# Patient Record
Sex: Female | Born: 1970 | Race: Black or African American | Hispanic: No | Marital: Married | Smoking: Never smoker
Health system: Southern US, Community
[De-identification: ages and names within clinical notes are randomized; demographics above are authoritative.]

## PROBLEM LIST (undated history)

## (undated) DIAGNOSIS — K219 Gastro-esophageal reflux disease without esophagitis: Secondary | ICD-10-CM

## (undated) DIAGNOSIS — R51 Headache: Secondary | ICD-10-CM

## (undated) DIAGNOSIS — K760 Fatty (change of) liver, not elsewhere classified: Secondary | ICD-10-CM

## (undated) DIAGNOSIS — N920 Excessive and frequent menstruation with regular cycle: Secondary | ICD-10-CM

## (undated) DIAGNOSIS — H409 Unspecified glaucoma: Secondary | ICD-10-CM

## (undated) DIAGNOSIS — R519 Headache, unspecified: Secondary | ICD-10-CM

## (undated) DIAGNOSIS — R600 Localized edema: Secondary | ICD-10-CM

## (undated) HISTORY — DX: Excessive and frequent menstruation with regular cycle: N92.0

## (undated) HISTORY — DX: Unspecified glaucoma: H40.9

---

## 1999-06-11 ENCOUNTER — Encounter: Payer: Self-pay | Admitting: Family Medicine

## 1999-06-11 ENCOUNTER — Encounter: Admission: RE | Admit: 1999-06-11 | Discharge: 1999-06-11 | Payer: Self-pay | Admitting: Family Medicine

## 2003-07-17 ENCOUNTER — Ambulatory Visit (HOSPITAL_COMMUNITY): Admission: RE | Admit: 2003-07-17 | Discharge: 2003-07-17 | Payer: Self-pay | Admitting: Obstetrics and Gynecology

## 2003-12-11 ENCOUNTER — Inpatient Hospital Stay (HOSPITAL_COMMUNITY): Admission: AD | Admit: 2003-12-11 | Discharge: 2003-12-15 | Payer: Self-pay | Admitting: Obstetrics and Gynecology

## 2003-12-12 ENCOUNTER — Encounter (INDEPENDENT_AMBULATORY_CARE_PROVIDER_SITE_OTHER): Payer: Self-pay | Admitting: *Deleted

## 2006-02-01 ENCOUNTER — Other Ambulatory Visit: Admission: RE | Admit: 2006-02-01 | Discharge: 2006-02-01 | Payer: Self-pay | Admitting: Obstetrics and Gynecology

## 2010-04-18 ENCOUNTER — Ambulatory Visit: Payer: Self-pay | Admitting: Family Medicine

## 2010-04-18 DIAGNOSIS — M549 Dorsalgia, unspecified: Secondary | ICD-10-CM | POA: Insufficient documentation

## 2010-04-18 DIAGNOSIS — K921 Melena: Secondary | ICD-10-CM

## 2010-04-18 LAB — CONVERTED CEMR LAB
Bilirubin Urine: NEGATIVE
Blood in Urine, dipstick: NEGATIVE
Glucose, Urine, Semiquant: NEGATIVE
Ketones, urine, test strip: NEGATIVE
Nitrite: NEGATIVE
Protein, U semiquant: NEGATIVE
Specific Gravity, Urine: 1.015
Urobilinogen, UA: 0.2
WBC Urine, dipstick: NEGATIVE
pH: 6.5

## 2010-04-22 ENCOUNTER — Telehealth (INDEPENDENT_AMBULATORY_CARE_PROVIDER_SITE_OTHER): Payer: Self-pay | Admitting: *Deleted

## 2010-04-22 LAB — CONVERTED CEMR LAB
ALT: 12 units/L (ref 0–35)
AST: 17 units/L (ref 0–37)
Albumin: 4 g/dL (ref 3.5–5.2)
Alkaline Phosphatase: 52 units/L (ref 39–117)
BUN: 7 mg/dL (ref 6–23)
Basophils Absolute: 0 10*3/uL (ref 0.0–0.1)
Basophils Relative: 0.4 % (ref 0.0–3.0)
Bilirubin, Direct: 0.1 mg/dL (ref 0.0–0.3)
CO2: 26 meq/L (ref 19–32)
Calcium: 8.8 mg/dL (ref 8.4–10.5)
Chloride: 109 meq/L (ref 96–112)
Cholesterol: 149 mg/dL (ref 0–200)
Creatinine, Ser: 0.6 mg/dL (ref 0.4–1.2)
Eosinophils Absolute: 0.1 10*3/uL (ref 0.0–0.7)
Eosinophils Relative: 1.7 % (ref 0.0–5.0)
GFR calc non Af Amer: 136.7 mL/min (ref >60)
Glucose, Bld: 74 mg/dL (ref 70–99)
HCT: 34.6 % — ABNORMAL LOW (ref 36.0–46.0)
HCV Ab: NEGATIVE
HDL: 46.2 mg/dL (ref >39.00)
Hemoglobin: 11.5 g/dL — ABNORMAL LOW (ref 12.0–15.0)
Hep B C IgM: NEGATIVE
Hep B Core Total Ab: NEGATIVE
Hep B S Ab: NEGATIVE
Hepatitis B Surface Ag: NEGATIVE
LDL Cholesterol: 92 mg/dL (ref 0–99)
Lymphocytes Relative: 39.4 % (ref 12.0–46.0)
Lymphs Abs: 2.1 10*3/uL (ref 0.7–4.0)
MCHC: 33.3 g/dL (ref 30.0–36.0)
MCV: 82.5 fL (ref 78.0–100.0)
Monocytes Absolute: 0.3 10*3/uL (ref 0.1–1.0)
Monocytes Relative: 6.5 % (ref 3.0–12.0)
Neutro Abs: 2.7 10*3/uL (ref 1.4–7.7)
Neutrophils Relative %: 52 % (ref 43.0–77.0)
Platelets: 311 10*3/uL (ref 150.0–400.0)
Potassium: 4.2 meq/L (ref 3.5–5.1)
RBC: 4.19 M/uL (ref 3.87–5.11)
RDW: 14.5 % (ref 11.5–14.6)
Sodium: 141 meq/L (ref 135–145)
TSH: 0.85 microintl units/mL (ref 0.35–5.50)
Total Bilirubin: 0.5 mg/dL (ref 0.3–1.2)
Total CHOL/HDL Ratio: 3
Total Protein: 6.7 g/dL (ref 6.0–8.3)
Triglycerides: 55 mg/dL (ref 0.0–149.0)
VLDL: 11 mg/dL (ref 0.0–40.0)
Vit D, 25-Hydroxy: 10 ng/mL — ABNORMAL LOW (ref 30–89)
WBC: 5.3 10*3/uL (ref 4.5–10.5)

## 2010-09-23 NOTE — Assessment & Plan Note (Signed)
Summary: NEW EST   Vital Signs:  Patient profile:   40 year old female Height:      59.25 inches Weight:      159 pounds BMI:     31.96 Pulse rate:   74 / minute BP sitting:   120 / 78  (left arm)  Vitals Entered By: Doristine Devoid CMA (April 18, 2010 10:43 AM) CC: NEW EST- lower back pain and noticed blood in stool    History of Present Illness: Cheyenne Larsen here today to establish care.  previous MD- [facility].  GYNSu Hilt.  1) back pain- L sided, will occasionally radiate across the back.  typically lumbar.  sxs started 2-3 months ago.  pain will radiate into butt.  no numbness or tingling, leg weakness, no bowel or bladder incontinence, no fevers.  does a lot of bending and lifting w/ work.  no particular time of day for pain.  pain improves w/ ibuprofen and tylenol.  sxs will occur at least 3x/week.  not worse w/ any particular movement.  2) blood in stool- had a 'very hard' BM the other morning.  when she wiped, 'it was nothing but blood'.  no blood since.  no blood in or on stool, just on TP.  hx of hemorrhoids 'years ago'.  stool was initially painful to pass.  no continued pain.  3) ? hepatitis exposure- pt reports husband was recently ill and when he had lab work done he was told his liver fxn was elevated and 'had hepatitis'.  had liver fxn repeated and 'they said everything was fine'.  reports he was previously a heavy drinker but wants to know what kind of hepatitis he had, and wants to know if she has it.    Preventive Screening-Counseling & Management  Alcohol-Tobacco     Alcohol drinks/day: 0     Smoking Status: never  Caffeine-Diet-Exercise     Does Patient Exercise: no      Sexual History:  currently monogamous.        Drug Use:  never.    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 Lungs are clear to auscultation, no crackles or wheezes. Heart:  Normal rate and regular rhythm. S1 and S2 normal without gallop, murmur, click, rub or other extra sounds. Abdomen:  soft, NT/ND, +BS, no hepatosplenomegaly Rectal:  small skin tag. Normal sphincter tone. No rectal masses or tenderness.  heme (-) Msk:  + TTP over paraspinal muscles on L.  (-) SLR bilaterally.  mild discomfort w/ flexion and extension Pulses:  +2 carotid, radial, DP Extremities:  no C/C/E Neurologic:  strength normal in all extremities, gait normal, and DTRs symmetrical and normal.   Skin:  no jaundice Cervical Nodes:  No lymphadenopathy noted Psych:  Cognition and judgment appear intact. Alert and cooperative with normal attention span  and concentration. No apparent delusions, illusions, hallucinations   Impression & Recommendations:  Problem # 1:  BACK PAIN (ICD-724.5) Assessment New most likely muscular.  no TTP over spine.  no red flags on hx or PE.  start NSAIDs, muscle relaxer.  reviewed supportive care and red flags that should prompt return.  Pt expresses understanding and is in agreement w/ this plan. Orders: UA Dipstick w/o Micro (manual) (29518)  Her updated medication list for this problem includes:    Naproxen 500 Mg Tabs (Naproxen) .Marland Kitchen... 1 tab by mouth two times a day x7 days and then as needed.   take w/ food.    Cyclobenzaprine Hcl 10 Mg Tabs (Cyclobenzaprine hcl) .Marland Kitchen... 1 by mouth nightly as needed for back pain  Problem # 2:  BLOOD IN STOOL (ICD-578.1) Assessment: New no abnormality on PE w/ exception of skin tag.  heme (-) in office.  given hx most likely a fissure.  will follow but no additional w/u at this time.  Pt expresses understanding and is in agreement w/ this plan.  Problem # 3:  PHYSICAL EXAMINATION (ICD-V70.0) labs collected for upcoming CPE. Orders: Venipuncture (84166) T-Vitamin D (25-Hydroxy) (06301-60109) TLB-Lipid Panel (80061-LIPID) TLB-BMP (Basic Metabolic Panel-BMET) (80048-METABOL) TLB-CBC Platelet - w/Differential (85025-CBCD) TLB-Hepatic/Liver Function Pnl (80076-HEPATIC) TLB-TSH (Thyroid Stimulating Hormone) (84443-TSH)  Problem # 4:  CONTACT OR EXPOSURE TO OTHER VIRAL DISEASES (ICD-V01.79) Assessment: New given ? exposure to hepatitis will check labs. Orders: T-Hepatitis B Core Antibody, IGM (32355-73220) T-Hepatitis B Core Antibody (25427-06237) T-Hepatitis B Surface Antigen (62831-51761) T-Hepatitis B Surface Antibody (60737-10626) T-Hepatitis C Antibody (94854-62703)  Complete Medication List: 1)  Naproxen 500 Mg Tabs (Naproxen) .Marland Kitchen.. 1 tab by mouth two times a day x7 days and then as needed.  take w/ food. 2)  Cyclobenzaprine Hcl 10 Mg Tabs (Cyclobenzaprine hcl) .Marland Kitchen.. 1 by mouth nightly as needed for back pain 3)  Vitamin D (ergocalciferol) 50000 Unit Caps (Ergocalciferol) .Marland Kitchen.. 1 by mouth once weekly for 12 weeks. 4)  Calicum, Vitamin D Supplement   Patient Instructions: 1)  Schedule your complete physical at your convenience- you can eat before this appt 2)  Take the Naproxen as directed- take w/ food to avoid upset stomach 3)  Use the cyclobenzaprine at night for muscle relief 4)  Use a heating pad 5)  Your blood was most likely due to a small tear and there is no additional workup needed 6)  Welcome!  We're glad to have  you! Prescriptions: CYCLOBENZAPRINE HCL 10 MG  TABS (CYCLOBENZAPRINE HCL) 1 by mouth nightly as needed for back pain  #30 x 0   Entered and Authorized by:   Neena Rhymes MD   Signed by:   Neena Rhymes MD on 04/18/2010   Method used:   Electronically to        [facility]  [id number]       [address]       [place], [place]  [address]       Ph: [phone] or [phone]       Fax: [phone]   RxID:   [id number] NAPROXEN 500 MG TABS (NAPROXEN) 1 tab by mouth two times a day x7 days and then as needed.  take w/ food.  #60 x 1   Entered and Authorized by:   Neena Rhymes MD   Signed by:   Neena Rhymes MD on 04/18/2010   Method used:   [facility] to  [id number]       [address]       [place], [place]  [address]       Ph: [phone] or [phone]       Fax: [phone]   RxID:   [id number]   Laboratory Results   Urine Tests    Routine Urinalysis   Glucose: negative   (Normal Range: Negative) Bilirubin: negative   (Normal Range: Negative) Ketone: negative   (Normal Range: Negative) Spec. Gravity: 1.015   (Normal Range: 1.003-1.035) Blood: negative   (Normal Range: Negative) pH: 6.5   (Normal Range: 5.0-8.0) Protein: negative   (Normal Range: Negative) Urobilinogen: 0.2   (Normal Range: 0-1) Nitrite: negative   (Normal Range: Negative) Leukocyte Esterace: negative   (Normal Range: Negative)

## 2010-09-23 NOTE — Progress Notes (Signed)
Summary: labs-lmom  Phone Note Outgoing Call   Call placed by: Doristine Devoid CMA,  April 22, 2010 1:21 PM Call placed to: Patient Summary of Call: labs look good.  mildly anemic but this is not unusual for menstruating women.  should take a multi-vitamin w/ iron daily.  Vit D level is very low.  needs to start 50,000 units weekly x12 weeks and then recheck level.  should also start a daily Ca+Vit D supplement.  does not show any evidence of hepatitis but is not immune to Hep B either.  would recommend starting vaccine series.  Follow-up for Phone Call        left msg w/ female to have patient return call.......Marland KitchenDoristine Devoid CMA  April 22, 2010 1:22 PM   Additional Follow-up for Phone Call Additional follow up Details #1::        Spoke with pt she is aware. Army Fossa CMA  April 22, 2010 4:05 PM     New/Updated Medications: VITAMIN D (ERGOCALCIFEROL) 50000 UNIT CAPS (ERGOCALCIFEROL) 1 by mouth once weekly for 12 weeks. * CALICUM, VITAMIN D SUPPLEMENT  Prescriptions: VITAMIN D (ERGOCALCIFEROL) 50000 UNIT CAPS (ERGOCALCIFEROL) 1 by mouth once weekly for 12 weeks.  #12 x 0   Entered by:   Army Fossa CMA   Authorized by:   Neena Rhymes MD   Signed by:   Army Fossa CMA on 04/22/2010   Method used:   [facility]        [id number]       [address]       [place], [place]  [address]       Ph: [phone] or [phone]       Fax: [phone]   RxID:   [id number]

## 2010-10-17 ENCOUNTER — Other Ambulatory Visit: Payer: Self-pay | Admitting: Family Medicine

## 2010-10-17 ENCOUNTER — Encounter (INDEPENDENT_AMBULATORY_CARE_PROVIDER_SITE_OTHER): Payer: PRIVATE HEALTH INSURANCE | Admitting: Family Medicine

## 2010-10-17 ENCOUNTER — Encounter: Payer: Self-pay | Admitting: Family Medicine

## 2010-10-17 ENCOUNTER — Other Ambulatory Visit (HOSPITAL_COMMUNITY)
Admission: RE | Admit: 2010-10-17 | Discharge: 2010-10-17 | Disposition: A | Discharge status: Home / Self Care | Payer: PRIVATE HEALTH INSURANCE | Source: Ambulatory Visit | Attending: Family Medicine | Admitting: Family Medicine

## 2010-10-17 DIAGNOSIS — M62838 Other muscle spasm: Secondary | ICD-10-CM | POA: Insufficient documentation

## 2010-10-17 DIAGNOSIS — D485 Neoplasm of uncertain behavior of skin: Secondary | ICD-10-CM | POA: Insufficient documentation

## 2010-10-17 DIAGNOSIS — Z0000 Encounter for general adult medical examination without abnormal findings: Secondary | ICD-10-CM

## 2010-10-17 DIAGNOSIS — Z01419 Encounter for gynecological examination (general) (routine) without abnormal findings: Secondary | ICD-10-CM | POA: Insufficient documentation

## 2010-10-17 DIAGNOSIS — N841 Polyp of cervix uteri: Secondary | ICD-10-CM | POA: Insufficient documentation

## 2010-10-17 DIAGNOSIS — E049 Nontoxic goiter, unspecified: Secondary | ICD-10-CM | POA: Insufficient documentation

## 2010-10-22 ENCOUNTER — Other Ambulatory Visit: Payer: Self-pay | Admitting: Family Medicine

## 2010-10-22 DIAGNOSIS — N841 Polyp of cervix uteri: Secondary | ICD-10-CM

## 2010-10-22 DIAGNOSIS — E010 Iodine-deficiency related diffuse (endemic) goiter: Secondary | ICD-10-CM

## 2010-10-27 ENCOUNTER — Other Ambulatory Visit: Payer: PRIVATE HEALTH INSURANCE

## 2010-10-30 NOTE — Assessment & Plan Note (Signed)
Summary: cpe/kn   Vital Signs:  Patient profile:   40 year old female Height:      59.25 inches Weight:      161 pounds BMI:     32.36 Pulse rate:   104 / minute BP sitting:   110 / 80  (left arm)  Vitals Entered By: Doristine Devoid CMA (October 17, 2010 3:07 PM) CC: CPX AND PAP Comments R shoulder muscle spasms   History of Present Illness: 40 yo woman here today for CPE w/ pap.  has not had mammogram  R shoulder pain- has been using heating pad w/out relief.  keeping pt awake.  sxs started  ~3 days ago.  no increase in activity or heavy lifting.  took Naproxen (more than directed) w/ some relief of pain.  pain located on top of shoulder- 'seems like it's moving'.  Preventive Screening-Counseling & Management  Alcohol-Tobacco     Alcohol drinks/day: 0     Smoking Status: never  Caffeine-Diet-Exercise     Does Patient Exercise: no      Sexual History:  currently monogamous.        Drug Use:  never.    Current Medications (verified): 1)  Calicum, Vitamin D Supplement 2)  Cyclobenzaprine Hcl 10 Mg  Tabs (Cyclobenzaprine Hcl) .Marland Kitchen.. 1 By Mouth 2 Times Daily As Needed For Back Pain.  Will Cause Drowsiness 3)  Naprosyn 500 Mg Tabs (Naproxen) .Marland Kitchen.. 1 Two Times A Day X7 Days and Then As Needed For Pain. 4)  Nasonex 50 Mcg/act Susp (Mometasone Furoate) .... 2 Sprays Each Nostril Once Daily  Allergies (verified): No Known Drug Allergies  Past History:  Past medical, surgical, family and social histories (including risk factors) reviewed for relevance to current acute and chronic problems.  Past Medical History: Migraines  cervical polyps  Past Surgical History: Reviewed history from 04/18/2010 and no changes required. Caesarean section  Family History: Reviewed history from 04/18/2010 and no changes required. CAD-maternal grandmother deceased MI HTN-mother,father DM-father type 2 STROKE-no COLON CA-no BREAST CA-no  Social History: Reviewed history from 04/18/2010  and no changes required. 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    Physical Exam  General:  Well-developed,well-nourished,in no acute distress; alert,appropriate and cooperative throughout examination Head:  NCAT Eyes:  No corneal or conjunctival inflammation noted. EOMI. Perrla. Funduscopic exam benign, without hemorrhages, exudates or papilledema. Vision grossly normal. Ears:  External ear exam shows no significant lesions or deformities.  Otoscopic examination reveals clear canals, tympanic membranes are intact bilaterally without bulging, retraction, inflammation or discharge. Hearing is grossly normal bilaterally. Nose:  edematous turbinates Mouth:  Oral mucosa and oropharynx without lesions or exudates.  Teeth in good repair. Neck:  generalized thyroid enlargement Breasts:  No mass, nodules, thickening, tenderness, bulging, retraction, inflamation, nipple discharge or skin changes noted.   Lungs:  Normal respiratory effort, chest expands symmetrically. Lungs are clear to auscultation, no crackles or wheezes. Heart:  Normal rate and regular rhythm. S1 and S2 normal without gallop, murmur, click, rub or other extra sounds. Abdomen:  soft, NT/ND, +BS, no hepatosplenomegaly Genitalia:  Pelvic Exam:        External: normal female genitalia without lesions or masses  Vagina: normal without lesions or masses        Cervix: multiple large polyps        Adnexa: normal bimanual exam without masses or fullness        Uterus: normal by palpation        Pap smear:  performed Msk:  + trapezius spasm on R Pulses:  +2 carotid, radial, DP Extremities:  no C/C/E Neurologic:  No cranial nerve deficits noted. Station and gait are normal. Plantar reflexes are down-going bilaterally. DTRs are symmetrical throughout. Sensory, motor and coordinative functions appear intact. Skin:  hyperpigmented nevi- multiple are concerning Cervical Nodes:  No lymphadenopathy noted Axillary Nodes:  No palpable lymphadenopathy Psych:  Cognition and judgment appear intact. Alert and cooperative with normal attention span and concentration. No apparent delusions, illusions, hallucinations   Impression & Recommendations:  Problem # 1:  PHYSICAL EXAMINATION (ICD-V70.0) Assessment Unchanged  pt's PE w/ multiple abnormalities that need referral (see below).  reviewed labs from August.  pt due for mammogram.  anticipatory guidance provided.  Orders: Radiology Referral (Radiology)  Problem # 2:  NEOPLASM OF UNCERTAIN BEHAVIOR OF SKIN (ICD-238.2) Assessment: New  multiple hyperpigmented moles- refer to derm  Orders: Dermatology Referral (Derma)  Problem # 3:  THYROMEGALY (ICD-240.9) Assessment: New  will get [place] to assess  Orders: Radiology Referral (Radiology)  Problem # 4:  CERVICAL POLYP (ICD-622.7) Assessment: New  multiple polyps/fibroids palpable on cervix.  pt aware of this, was seeing GYN and they were following w/ [place].  will repeat US.  Orders: Radiology Referral (Radiology)  Problem # 5:  MUSCLE SPASM, TRAPEZIUS (ICD-728.85) Assessment: New pt's back and shoulder pain is due to muscle spasm.  start NSAIDs and muscle relaxer.  heat.  reviewed supportive care and red flags that should prompt return.  Pt expresses understanding and is in agreement w/ this plan.  Complete Medication List: 1)  Calicum, Vitamin D Supplement  2)  Cyclobenzaprine Hcl 10 Mg Tabs (Cyclobenzaprine hcl) .Marland Kitchen.. 1 by mouth 2 times daily as needed for back pain.  will cause drowsiness 3)   Naprosyn 500 Mg Tabs (Naproxen) .Marland Kitchen.. 1 two times a day x7 days and then as needed for pain. 4)  Nasonex 50 Mcg/act Susp (Mometasone furoate) .... 2 sprays each nostril once daily  Patient Instructions: 1)  We will call you with the appointments for your thyroid and cervical ultrasounds 2)  We will call you with your dermatology appt 3)  Start the nasal spray daily to decrease your congestion and post nasal drip 4)  Add Claritin or Zyrtec daily for allergy relief 5)  Take the Naprosyn as directed for inflammation 6)  Use the cyclobenzaprine (muscle relaxer) as needed for pain 7)  Continue your heating pad 8)  Call with any questions or concerns 9)  Have a great weekend! Prescriptions: NASONEX 50 MCG/ACT SUSP (MOMETASONE FUROATE) 2 sprays each nostril once daily  #1 x 3   Entered and Authorized by:   Neena Rhymes MD   Signed by:   Neena Rhymes MD on 10/17/2010   Method used:   [facility] to        [facility] [id number]       [address]       [place], [place]  [address]       Ph: [phone] or [phone]       Fax: [phone]   RxID:   [id number] NAPROSYN 500 MG  TABS (NAPROXEN) 1 two times a day x7 days and then as needed for pain.  #60 x 0   Entered and Authorized by:   Neena Rhymes MD   Signed by:   Neena Rhymes MD on 10/17/2010   Method used:   [facility] to        [id number]       [address]       [place], [place]  [address]       Ph: [phone] or [phone]       Fax: [phone]   RxID:   [id number] CYCLOBENZAPRINE HCL 10 MG  TABS (CYCLOBENZAPRINE HCL) 1 by mouth 2 times daily as needed for back pain.  will cause drowsiness  #30 x 0   Entered and Authorized by:   Neena Rhymes MD   Signed by:   Neena Rhymes MD on 10/17/2010   Method used:   [facility]        [id number]       [address]       [place], [place]  [address]       Ph: [phone] or [phone]       Fax: [phone]   RxID:   [id number]    Orders Added: 1)  Est. Patient 40-64 years [99396] 2)  Radiology Referral [Radiology] 3)  Radiology Referral [Radiology] 4)  Radiology Referral [Radiology] 5)  Dermatology Referral [Derma] 6)  Est. Patient Level III [phone]

## 2010-10-31 ENCOUNTER — Ambulatory Visit
Admission: RE | Admit: 2010-10-31 | Discharge: 2010-10-31 | Disposition: A | Discharge status: Home / Self Care | Payer: PRIVATE HEALTH INSURANCE | Source: Ambulatory Visit | Attending: Family Medicine | Admitting: Family Medicine

## 2010-10-31 DIAGNOSIS — E010 Iodine-deficiency related diffuse (endemic) goiter: Secondary | ICD-10-CM

## 2010-10-31 DIAGNOSIS — N841 Polyp of cervix uteri: Secondary | ICD-10-CM

## 2010-11-01 ENCOUNTER — Encounter: Payer: Self-pay | Admitting: Family Medicine

## 2011-01-09 NOTE — Op Note (Signed)
Cheyenne Larsen, PITCHER                       ACCOUNT NO.:  [id number]   MEDICAL RECORD NO.:  [id number]                   PATIENT TYPE:  INP   LOCATION:  9147                                 FACILITY:  [facility]   PHYSICIAN:  Charles A. Sydnee Cabal, MD            DATE OF BIRTH:  24-Feb-1971   DATE OF PROCEDURE:  12/12/2003  DATE OF DISCHARGE:                                 OPERATIVE REPORT   PREOPERATIVE DIAGNOSES:  1. Intrauterine pregnancy at 40 weeks 2 days.  2. Fetal intolerance of labor.  3. Undesired fertility.   POSTOPERATIVE DIAGNOSES:  1. Intrauterine pregnancy at 40 weeks 2 days.  2. Fetal intolerance of labor.  3. Undesired fertility.  4. Nuchal cord x1.  5. Meconium.   PROCEDURES:  1. Primary low transverse cesarean section.  2. Modified Pomeroy bilateral tubal ligation.   SURGEON:  Charles A. Sydnee Cabal, MD   ASSISTANT:  Juluis Mire, M.D.   COMPLICATIONS:  Nuchal cord x1 and meconium.   SPECIMENS:  Right and left fallopian tube segments to pathology.  Placenta  to pathology.  Cord pH was 7.36.   FINDINGS:  A vigorous female, Apgars 9 and 9.  Normal tubes, ovaries, and  uterus.   ESTIMATED BLOOD LOSS:  500 mL.   Instrument, sponge, and needle count correct x2.   DESCRIPTION OF PROCEDURE:  The patient was taken to the operating room and  placed in the supine position after spinal anesthetic was given.  Anesthesia  was adequate and sterile prep and drape was undertaken.  A Pfannenstiel  incision was made with the knife and carried down to fascia.  The fascia was  incised with a knife and Mayo scissors.  The rectus sheath was released  superiorly and inferiorly.  The rectus muscles were sharply dissected in the  midline, and the peritoneum was entered with the Metzenbaum scissors.  The  vesicouterine peritoneum was incised in a smiling fashion.  Blunt dissection  was used to develop the bladder flap in the lower uterine segment.  A  transverse incision was made  to amniotomy.  There was no damage to the baby  noted.  Traction was used to extend the incision.  A hand was inserted.  Fundal pressure was applied by the operator's assistant.  The baby was  delivered without difficulty.  The nuchal cord was reduced.  DeLee suction  was used on the abdomen prior to the baby crying.  The baby was thereafter  vigorous, handed off to neonatologist after cord was clamped and cut.  Cord  gas was sent, cord blood was taken, the placenta was manually extracted.  The internal surface of the uterus was wiped with a moistened lap.  Two-  layered closure with the first layer running locking, the second layer of  running nonlocking imbricating with #1 chromic was placed with good  hemostasis.  There was a slight bleeding area just to the left of midline.  A figure-of-eight suture x2 achieved adequate hemostasis in this area.  Fallopian tubes were grasped in the middle segment bilaterally successively.  The middle segment was doubly ligated with plain gut, excised, and sent to  pathology.  The lumen was clear __________ sectioned on either side, and  hemostasis was good of the pedicle bilaterally.  The uterine incision was  again visualized and noted to be of excellent hemostasis.  Subfascial  hemostasis was good.  Fascia was closed with #1 Vicryl running nonlocking  suture.  Subcutaneous hemostasis was noted good, and the area was irrigated.  Sterile skin clips were used to close the skin.  A sterile dressing was  applied.  The patient was taken from the operating room to the recovery room  with the physician in attendance, having tolerated the procedure well.                                               Charles A. Sydnee Cabal, MD    CAD/MEDQ  D:  12/12/2003  T:  12/13/2003  Job:  [id number]

## 2011-01-09 NOTE — Discharge Summary (Signed)
Cheyenne Larsen, Cheyenne Larsen                       ACCOUNT NO.:  [id number]   MEDICAL RECORD NO.:  [id number]                   PATIENT TYPE:  INP   LOCATION:  9147                                 FACILITY:  [facility]   PHYSICIAN:  Charles A. Sydnee Cabal, MD            DATE OF BIRTH:  [birth year]   DATE OF ADMISSION:  12/11/2003  DATE OF DISCHARGE:  12/15/2003                                 DISCHARGE SUMMARY   PRIMARY DISCHARGE DIAGNOSES:  1. Intrauterine pregnancy, 40 weeks and 2 days.  2. Reactive non-stress test with non deceleration on her back in supine     position.  3. Fetal intolerance of labor.  4. Undesired fertility.   PROCEDURE:  1. Cervidil and Pitocin induction.  2. Primary low transverse cesarean section.  3. Modified Pomeroy bilateral tubal ligation.   DISPOSITION:  The patient was discharged home to return in 1 to 2 days to  have staples discontinued at the office.  She was given convalescent  instructions, notify if temperature greater than 101, increasing pain or  bleeding, redness about the incision or incisional drainage.   MEDICATIONS:  1. Percocet 5/325 1 to 2 p.o. q.4h p.r.n.  2. Prenatal vitamin plus iron once daily.   She will convalesce at home and is counseled against driving for about 10  days.  A shower will be okay as needed.  Refrain from submergence in bath.   History and physical as dictated and on the chart. See dictated note.   HOSPITAL COURSE:  The patient was admitted and underwent surgery as noted  above.  She developed bradycardia during induction to the 90's for 6 minutes  and then was noted to have late decelerations.  She was in early labor with  cervix only about 3 cm.  A cesarean section was recommended.  She wished to  undergo bilateral tubal ligation at that time as well.  Surgery was  undertaken with delivery of a viable female, Apgar's 9 and 9.  Postoperatively  the patient remained stable and had a routine postoperative course.  On  postoperative day 1 she was given a general diet as tolerated.  Intravenous  fluid was discontinued.  Foley catheter was discontinued.  She was out of  bed without difficulty.  Hemoglobin and hematocrit returned 10.3/32.2 on  postoperative day number 1, December 13, 2003.  On postoperative day number 2  she continued to do well.  A circumcision was carried out on the baby per  her request.  She was discharged home on postoperative day number 3 with  follow up as noted above.                                               Charles A. Sydnee Cabal, MD    CAD/MEDQ  D:  12/25/2003  T:  12/26/2003  Job:  [id number]

## 2011-01-09 NOTE — H&P (Signed)
Cheyenne, Larsen                         ACCOUNT NO.:  [id number]   MEDICAL RECORD NO.:  [id number]                   PATIENT TYPE:   LOCATION:                                       FACILITY:   PHYSICIAN:  Charles A. Delcambre, MD            DATE OF BIRTH:   DATE OF ADMISSION:  DATE OF DISCHARGE:                                HISTORY & PHYSICAL   HISTORY OF PRESENT ILLNESS:  A 40 year old para 1-0-0-1 noted 2 days over  due date now with [facility] December 09, 2003.  To have AFI and NST done today  secondary to questionable history of ruptured membranes 1 day ago.  She had  negative nitrazine pool/fern yesterday, moderate amount of discharge creamy  in color.  Cervix was fingertip and posterior and soft.  AFI today was  normal at 15.3 cm.  NST is very reactive today.  Fetal movement is  auscultated and felt.  However, there was one period of time over several  minutes there was a slow, gradual deceleration.  This was not bradycardic.  There was reactivity before and after.  The patient was on her back when  this occurred.  Left lateral decubitus positioning was done and the strip  remained quite reactive thereafter.  She was observed for a period of time  and there were no further decelerations.  With this scenario I recommended  induction via Cervidil tonight and she gave informed consent and wished to  proceed as well.   PAST MEDICAL HISTORY:  None.   SURGICAL HISTORY:  SVD x1.   MEDICATIONS:  Prenatal vitamins.   ALLERGIES:  No known drug allergies.   SOCIAL HISTORY:  No tobacco, ethanol, or drug use or STD exposure.  She is  married, in a monogamous relationship with her husband.   FAMILY HISTORY:  Ovarian cancer in a paternal grandmother.  Weak family  history of hypertension and glaucoma.   REVIEW OF SYSTEMS:  Denies vaginal bleeding or leakage, nausea, vomiting,  diarrhea, constipation, chest pain, shortness of breath, wheezing.   PHYSICAL EXAMINATION:  GENERAL:   Alert and oriented x3.  VITAL SIGNS:  Blood pressure 110/70 yesterday, respirations 16, pulse 90,  afebrile.  HEENT:  Within normal limits grossly.  NECK:  Supple without thyromegaly or adenopathy.  LUNGS:  Clear bilaterally.  HEART:  Regular rate and rhythm, a 2/6 systolic ejection murmur in left  sternal border.  BREASTS:  Deferred, symmetrical.  ABDOMEN:  Soft, flat, nontender, gravid.  Fundal height 40.  NST:  Heart  rate 150s and reactive as noted above.  PELVIC:  As noted above yesterday, not repeated today, with cervix  fingertip, posterior, and soft.  EXTREMITIES:  Mild edema.   ASSESSMENT:  1. Intrauterine pregnancy at 40 weeks 2 days.  2. Reactive nonstress test but with one deceleration on her back.   PLAN:  Fetal movement precautions were given.  Plan induction later tonight  with  Cervidil.  She is in agreement.  She will be allowed to go home and  have dinner and then return.  Will plan Cervidil tonight and Pitocin  induction in the morning.  All questions were answered and we will proceed  as outlined.                                               Charles A. Sydnee Cabal, MD    CAD/MEDQ  D:  12/11/2003  T:  12/11/2003  Job:  [id number]

## 2012-01-08 ENCOUNTER — Ambulatory Visit (INDEPENDENT_AMBULATORY_CARE_PROVIDER_SITE_OTHER): Payer: 59 | Admitting: Family Medicine

## 2012-01-08 ENCOUNTER — Other Ambulatory Visit (HOSPITAL_COMMUNITY)
Admission: RE | Admit: 2012-01-08 | Discharge: 2012-01-08 | Disposition: A | Discharge status: Home / Self Care | Payer: 59 | Source: Ambulatory Visit | Attending: Family Medicine | Admitting: Family Medicine

## 2012-01-08 ENCOUNTER — Encounter: Payer: Self-pay | Admitting: Family Medicine

## 2012-01-08 VITALS — BP 124/85 | HR 81 | Temp 98.6°F | Ht 59.0 in | Wt 157.8 lb

## 2012-01-08 DIAGNOSIS — N920 Excessive and frequent menstruation with regular cycle: Secondary | ICD-10-CM

## 2012-01-08 DIAGNOSIS — Z832 Family history of diseases of the blood and blood-forming organs and certain disorders involving the immune mechanism: Secondary | ICD-10-CM

## 2012-01-08 DIAGNOSIS — Z124 Encounter for screening for malignant neoplasm of cervix: Secondary | ICD-10-CM

## 2012-01-08 DIAGNOSIS — Z01419 Encounter for gynecological examination (general) (routine) without abnormal findings: Secondary | ICD-10-CM

## 2012-01-08 DIAGNOSIS — K649 Unspecified hemorrhoids: Secondary | ICD-10-CM | POA: Insufficient documentation

## 2012-01-08 DIAGNOSIS — Z0000 Encounter for general adult medical examination without abnormal findings: Secondary | ICD-10-CM | POA: Insufficient documentation

## 2012-01-08 DIAGNOSIS — Z8249 Family history of ischemic heart disease and other diseases of the circulatory system: Secondary | ICD-10-CM

## 2012-01-08 DIAGNOSIS — K219 Gastro-esophageal reflux disease without esophagitis: Secondary | ICD-10-CM | POA: Insufficient documentation

## 2012-01-08 LAB — HEPATIC FUNCTION PANEL
Indirect Bilirubin: 0.1 mg/dL (ref 0.0–0.9)
Total Protein: 6.7 g/dL (ref 6.0–8.3)

## 2012-01-08 LAB — LIPID PANEL
Cholesterol: 145 mg/dL (ref 0–200)
Triglycerides: 98 mg/dL (ref <150)
VLDL: 20 mg/dL (ref 0–40)

## 2012-01-08 LAB — BASIC METABOLIC PANEL
CO2: 23 mEq/L (ref 19–32)
Calcium: 9 mg/dL (ref 8.4–10.5)
Creat: 0.67 mg/dL (ref 0.50–1.10)

## 2012-01-08 LAB — TSH: TSH: 1.134 u[IU]/mL (ref 0.350–4.500)

## 2012-01-08 MED ORDER — HYDROCORTISONE ACE-PRAMOXINE 2.5-1 % RE CREA: TOPICAL_CREAM | Freq: Three times a day (TID) | RECTAL | Status: AC

## 2012-01-08 MED ORDER — DIBUCAINE 1 % EX OINT: TOPICAL_OINTMENT | Freq: Three times a day (TID) | CUTANEOUS | Status: AC | PRN

## 2012-01-08 MED ORDER — OMEPRAZOLE 40 MG PO CPDR: 40.0000 mg | DELAYED_RELEASE_CAPSULE | Freq: Every day | ORAL | Status: DC

## 2012-01-08 NOTE — Patient Instructions (Signed)
We'll notify you of your lab results Call [facility] and schedule your mammogram- [phone] Someone will call you with your hematology, GYN, and surgery appts Start the Omeprazole daily for the reflux Use the creams for the hemorrhoids as needed Call with any questions or concerns Have a great weekend!

## 2012-01-08 NOTE — Progress Notes (Signed)
  Subjective:    Patient ID: Cheyenne Larsen, female    DOB: 11-Mar-1971, 41 y.o.   MRN: [id number]  HPI CPE- overdue for mammo (march), pap today.  Hemorrhoids- chronic problem, intermittent flares.  Has been using preparation H wipes and OTC cream w/out relief.  Interested in talking about tx options.    Family hx of blood clots- sister had clot in late 30s.  Uncertain as to whether she was on OCPs.  Mom recently w/ multiple PEs.  Wants to know if she's at risk.  Menorrhagia- pt reports very heavy cycles recently.  Has required 2-3 pads at a time due to passing clots.  Periods remain regular.  No OCPs due to family hx of clots.   Review of Systems Patient reports no vision/ hearing changes, adenopathy,fever, weight change,  persistant/recurrent hoarseness , swallowing issues, chest pain, palpitations, edema, persistant/recurrent cough, hemoptysis, dyspnea (rest/exertional/paroxysmal nocturnal), gastrointestinal bleeding (melena, rectal bleeding), abdominal pain, bowel changes, GU symptoms (dysuria, hematuria, incontinence), Gyn symptoms (abnormal  bleeding, pain),  syncope, focal weakness, memory loss, numbness & tingling, skin/hair/nail changes, abnormal bruising, anxiety, or depression.   +GERD    Objective:   Physical Exam  General Appearance:    Alert, cooperative, no distress, appears stated age  Head:    Normocephalic, without obvious abnormality, atraumatic  Eyes:    PERRL, conjunctiva/corneas clear, EOM's intact, fundi    benign, both eyes  Ears:    Normal TM's and external ear canals, both ears  Nose:   Nares normal, septum midline, mucosa normal, no drainage    or sinus tenderness  Throat:   Lips, mucosa, and tongue normal; teeth and gums normal  Neck:   Supple, symmetrical, trachea midline, no adenopathy;    Thyroid: no enlargement/tenderness/nodules  Back:     Symmetric, no curvature, ROM normal, no CVA tenderness  Lungs:     Clear to auscultation bilaterally,  respirations unlabored  Chest Wall:    No tenderness or deformity   Heart:    Regular rate and rhythm, S1 and S2 normal, no murmur, rub   or gallop  Breast Exam:    No tenderness, masses, or nipple abnormality  Abdomen:     Soft, non-tender, bowel sounds active all four quadrants,    no masses, no organomegaly  Genitalia:    External genitalia normal, cervix normal in appearance, no CMT, uterus in normal size and position, adnexa w/out mass or tenderness, mucosa pink and moist, no lesions or discharge present  Rectal:    Normal external appearance  Extremities:   Extremities normal, atraumatic, no cyanosis or edema  Pulses:   2+ and symmetric all extremities  Skin:   Skin color, texture, turgor normal, no rashes or lesions  Lymph nodes:   Cervical, supraclavicular, and axillary nodes normal  Neurologic:   CNII-XII intact, normal strength, sensation and reflexes    throughout          Assessment & Plan:

## 2012-01-09 LAB — CBC WITH DIFFERENTIAL/PLATELET
Basophils Absolute: 0 10*3/uL (ref 0.0–0.1)
Eosinophils Absolute: 0.2 10*3/uL (ref 0.0–0.7)
Eosinophils Relative: 4 % (ref 0–5)
Lymphs Abs: 2.3 10*3/uL (ref 0.7–4.0)
MCH: 25.5 pg — ABNORMAL LOW (ref 26.0–34.0)
MCV: 79.1 fL (ref 78.0–100.0)
Platelets: 363 10*3/uL (ref 150–400)
RDW: 14.6 % (ref 11.5–15.5)

## 2012-01-11 ENCOUNTER — Encounter: Payer: Self-pay | Admitting: *Deleted

## 2012-01-11 LAB — VITAMIN D 1,25 DIHYDROXY: Vitamin D 1, 25 (OH)2 Total: 79 pg/mL — ABNORMAL HIGH (ref 18–72)

## 2012-01-13 ENCOUNTER — Encounter: Payer: Self-pay | Admitting: *Deleted

## 2012-01-19 NOTE — Assessment & Plan Note (Signed)
New.  Pt has struggled w/ this and wants to talk about definitive tx options.  Start topical meds and refer to surgery for discussion on tx.

## 2012-01-19 NOTE — Assessment & Plan Note (Signed)
Pap collected. 

## 2012-01-19 NOTE — Assessment & Plan Note (Signed)
New.  Pt w/ very heavy periods but due to family hx of blood clots will not start OCPs.  Will refer to GYN for discussion of non-hormonal tx options.  Pt expressed understanding and is in agreement w/ plan.

## 2012-01-19 NOTE — Assessment & Plan Note (Signed)
Pt's sxs consistent w/ GERD.  Start PPI.  Reviewed supportive care and red flags that should prompt return.  Pt expressed understanding and is in agreement w/ plan.

## 2012-01-19 NOTE — Assessment & Plan Note (Signed)
New.  Refer to hematology for complete w/u.

## 2012-01-19 NOTE — Assessment & Plan Note (Signed)
New.  Pt's PE WNL.  Pt to call and schedule mammo.  Check labs.  Anticipatory guidance provided.

## 2012-01-20 ENCOUNTER — Telehealth: Payer: Self-pay | Admitting: Hematology & Oncology

## 2012-01-20 NOTE — Telephone Encounter (Signed)
Called patient to schedule appointment she was busy and said she would call me back and took down my information

## 2012-01-22 ENCOUNTER — Telehealth: Payer: Self-pay | Admitting: Hematology & Oncology

## 2012-01-22 NOTE — Telephone Encounter (Signed)
LEFT PATIENT MESSAGE TO CALL FOR APPOINTMENT

## 2012-01-26 ENCOUNTER — Telehealth: Payer: Self-pay | Admitting: Hematology & Oncology

## 2012-01-26 ENCOUNTER — Encounter: Payer: Self-pay | Admitting: Hematology & Oncology

## 2012-01-26 NOTE — Telephone Encounter (Signed)
Patient called back said she had lots of appointments and was taking off work a lot and would call back to schedule appointment at a latter time

## 2012-01-26 NOTE — Telephone Encounter (Signed)
Left pt message to call for appointment sent referring fax letter cant get patient to schedule

## 2012-01-29 ENCOUNTER — Ambulatory Visit (INDEPENDENT_AMBULATORY_CARE_PROVIDER_SITE_OTHER): Payer: 59 | Admitting: Surgery

## 2012-04-07 ENCOUNTER — Encounter: Payer: Self-pay | Admitting: Family Medicine

## 2013-05-16 ENCOUNTER — Encounter: Payer: Self-pay | Admitting: *Deleted

## 2013-06-22 ENCOUNTER — Encounter: Payer: 59 | Admitting: Family Medicine

## 2013-07-10 ENCOUNTER — Encounter: Payer: 59 | Admitting: Family Medicine

## 2013-07-26 ENCOUNTER — Telehealth: Payer: Self-pay

## 2013-07-26 NOTE — Telephone Encounter (Addendum)
Left message for call back Non identifiable Medication and allergies:  Reviewed and updated  90 day supply/mail order: na Local pharmacy: [facility] [address] [place]   Immunizations due:  Tdap?  A/P:   No changes to FH or PSH or personal hx Flu vaccine at work 05/2013 Pap--12/2011--nml--referred to gyn for heavy menses Hasn't made appt for [facility]  MMG-03/2012--[facility]--neg  To Discuss with Provider: Pain in Right leg

## 2013-07-27 ENCOUNTER — Encounter: Payer: Self-pay | Admitting: Family Medicine

## 2013-07-27 ENCOUNTER — Ambulatory Visit (INDEPENDENT_AMBULATORY_CARE_PROVIDER_SITE_OTHER): Payer: Managed Care, Other (non HMO) | Admitting: Family Medicine

## 2013-07-27 VITALS — BP 122/84 | HR 86 | Temp 98.2°F | Resp 16 | Ht 61.0 in | Wt 166.4 lb

## 2013-07-27 DIAGNOSIS — Z0000 Encounter for general adult medical examination without abnormal findings: Secondary | ICD-10-CM

## 2013-07-27 DIAGNOSIS — M79609 Pain in unspecified limb: Secondary | ICD-10-CM

## 2013-07-27 DIAGNOSIS — L989 Disorder of the skin and subcutaneous tissue, unspecified: Secondary | ICD-10-CM

## 2013-07-27 DIAGNOSIS — M79651 Pain in right thigh: Secondary | ICD-10-CM | POA: Insufficient documentation

## 2013-07-27 DIAGNOSIS — R238 Other skin changes: Secondary | ICD-10-CM

## 2013-07-27 LAB — HEPATIC FUNCTION PANEL
Albumin: 3.9 g/dL (ref 3.5–5.2)
Total Bilirubin: 0.5 mg/dL (ref 0.3–1.2)

## 2013-07-27 LAB — CBC WITH DIFFERENTIAL/PLATELET
Basophils Relative: 0.5 % (ref 0.0–3.0)
Eosinophils Absolute: 0.1 10*3/uL (ref 0.0–0.7)
HCT: 37.7 % (ref 36.0–46.0)
Hemoglobin: 12.5 g/dL (ref 12.0–15.0)
Lymphs Abs: 2 10*3/uL (ref 0.7–4.0)
MCHC: 33.2 g/dL (ref 30.0–36.0)
MCV: 81.1 fl (ref 78.0–100.0)
Monocytes Absolute: 0.3 10*3/uL (ref 0.1–1.0)
Neutro Abs: 3.6 10*3/uL (ref 1.4–7.7)
Neutrophils Relative %: 59.5 % (ref 43.0–77.0)
RBC: 4.65 Mil/uL (ref 3.87–5.11)

## 2013-07-27 LAB — BASIC METABOLIC PANEL
BUN: 12 mg/dL (ref 6–23)
Calcium: 9 mg/dL (ref 8.4–10.5)
Creatinine, Ser: 0.7 mg/dL (ref 0.4–1.2)
GFR: 113.24 mL/min (ref >60.00)
Glucose, Bld: 76 mg/dL (ref 70–99)

## 2013-07-27 LAB — LIPID PANEL
Cholesterol: 157 mg/dL (ref 0–200)
HDL: 48.9 mg/dL (ref >39.00)
VLDL: 6.8 mg/dL (ref 0.0–40.0)

## 2013-07-27 LAB — TSH: TSH: 0.64 u[IU]/mL (ref 0.35–5.50)

## 2013-07-27 MED ORDER — NAPROXEN 500 MG PO TABS: 500.0000 mg | ORAL_TABLET | Freq: Two times a day (BID) | ORAL | Status: AC

## 2013-07-27 NOTE — Assessment & Plan Note (Signed)
New.  Refer to derm due to pt's insistence on continued use of perm/relaxer.  Will follow.

## 2013-07-27 NOTE — Assessment & Plan Note (Signed)
New.  Suspect deep tissue bruise.  No obvious bruising.  No limited motion of hip.  Start Naproxen twice daily.  Heat.  Reviewed supportive care and red flags that should prompt return.  Pt expressed understanding and is in agreement w/ plan.

## 2013-07-27 NOTE — Assessment & Plan Note (Signed)
Pt's PE WNL.  UTD on health maintenance.  Check labs.  Anticipatory guidance provided.  

## 2013-07-27 NOTE — Progress Notes (Signed)
   Subjective:    Patient ID: Cheyenne Larsen, female    DOB: 11/02/1970, 42 y.o.   MRN: [id number]  HPI CPE- UTD on pap, mammo.   Review of Systems Patient reports no vision/ hearing changes, adenopathy,fever, weight change,  persistant/recurrent hoarseness , swallowing issues, chest pain, palpitations, edema, persistant/recurrent cough, hemoptysis, dyspnea (rest/exertional/paroxysmal nocturnal), gastrointestinal bleeding (melena, rectal bleeding), abdominal pain, significant heartburn, bowel changes, GU symptoms (dysuria, hematuria, incontinence), Gyn symptoms (abnormal  bleeding, pain),  syncope, focal weakness, memory loss, numbness & tingling, hair/nail changes, abnormal bruising or bleeding, anxiety, or depression.  + scalp irritation- will have dryness, itching, and occasionally break out in sores + R thigh pain- slipped and fell in water, landing on R thigh.  No pain w/ hip flexion/extension, rotation.     Objective:   Physical Exam General Appearance:    Alert, cooperative, no distress, appears stated age  Head:    Normocephalic, without obvious abnormality, atraumatic  Eyes:    PERRL, conjunctiva/corneas clear, EOM's intact, fundi    benign, both eyes  Ears:    Normal TM's and external ear canals, both ears  Nose:   Nares normal, septum midline, mucosa normal, no drainage    or sinus tenderness  Throat:   Lips, mucosa, and tongue normal; teeth and gums normal  Neck:   Supple, symmetrical, trachea midline, no adenopathy;    Thyroid: no enlargement/tenderness/nodules  Back:     Symmetric, no curvature, ROM normal, no CVA tenderness  Lungs:     Clear to auscultation bilaterally, respirations unlabored  Chest Wall:    No tenderness or deformity   Heart:    Regular rate and rhythm, S1 and S2 normal, no murmur, rub   or gallop  Breast Exam:    Deferred to GYN  Abdomen:     Soft, non-tender, bowel sounds active all four quadrants,    no masses, no organomegaly  Genitalia:     Deferred to GYN  Rectal:    Extremities:   Extremities normal, atraumatic, no cyanosis or edema  Pulses:   2+ and symmetric all extremities  Skin:   Skin color, texture, turgor normal, no rashes or lesions  Lymph nodes:   Cervical, supraclavicular, and axillary nodes normal  Neurologic:   CNII-XII intact, normal strength, sensation and reflexes    throughout          Assessment & Plan:

## 2013-07-27 NOTE — Patient Instructions (Signed)
Follow up in 1 year or as needed We'll call with your dermatology appt for your scalp Start the Naproxen twice daily- take w/ food- for 1 week and then as needed for pain Heating pad as needed We'll notify you of your lab results and make any changes if needed Call with any questions or concerns Happy Holidays!!

## 2013-07-28 ENCOUNTER — Encounter: Payer: Self-pay | Admitting: General Practice

## 2013-08-02 ENCOUNTER — Encounter: Payer: Self-pay | Admitting: *Deleted

## 2013-08-28 ENCOUNTER — Encounter: Payer: 59 | Admitting: Family Medicine

## 2013-12-01 ENCOUNTER — Encounter: Payer: Self-pay | Admitting: Family Medicine

## 2013-12-01 ENCOUNTER — Ambulatory Visit (INDEPENDENT_AMBULATORY_CARE_PROVIDER_SITE_OTHER): Payer: Managed Care, Other (non HMO) | Admitting: Family Medicine

## 2013-12-01 ENCOUNTER — Ambulatory Visit: Payer: Managed Care, Other (non HMO)

## 2013-12-01 VITALS — BP 112/78 | HR 100 | Temp 98.3°F | Resp 16 | Wt 171.5 lb

## 2013-12-01 DIAGNOSIS — R609 Edema, unspecified: Secondary | ICD-10-CM

## 2013-12-01 DIAGNOSIS — R946 Abnormal results of thyroid function studies: Secondary | ICD-10-CM

## 2013-12-01 LAB — HEPATIC FUNCTION PANEL
ALT: 14 U/L (ref 0–35)
AST: 14 U/L (ref 0–37)
Albumin: 3.7 g/dL (ref 3.5–5.2)
Alkaline Phosphatase: 65 U/L (ref 39–117)
BILIRUBIN TOTAL: 0.4 mg/dL (ref 0.3–1.2)
Bilirubin, Direct: 0 mg/dL (ref 0.0–0.3)
TOTAL PROTEIN: 7 g/dL (ref 6.0–8.3)

## 2013-12-01 LAB — BASIC METABOLIC PANEL
BUN: 11 mg/dL (ref 6–23)
CALCIUM: 9.1 mg/dL (ref 8.4–10.5)
CO2: 24 meq/L (ref 19–32)
CREATININE: 0.7 mg/dL (ref 0.4–1.2)
Chloride: 105 mEq/L (ref 96–112)
GFR: 120.76 mL/min (ref >60.00)
Glucose, Bld: 108 mg/dL — ABNORMAL HIGH (ref 70–99)
Potassium: 3.6 mEq/L (ref 3.5–5.1)
Sodium: 135 mEq/L (ref 135–145)

## 2013-12-01 LAB — CBC WITH DIFFERENTIAL/PLATELET
BASOS PCT: 1.5 % (ref 0.0–3.0)
Basophils Absolute: 0.1 10*3/uL (ref 0.0–0.1)
EOS PCT: 3.2 % (ref 0.0–5.0)
Eosinophils Absolute: 0.2 10*3/uL (ref 0.0–0.7)
HCT: 38.4 % (ref 36.0–46.0)
Hemoglobin: 12.8 g/dL (ref 12.0–15.0)
LYMPHS PCT: 32.4 % (ref 12.0–46.0)
Lymphs Abs: 2.2 10*3/uL (ref 0.7–4.0)
MCHC: 33.3 g/dL (ref 30.0–36.0)
MCV: 81.1 fl (ref 78.0–100.0)
Monocytes Absolute: 0.3 10*3/uL (ref 0.1–1.0)
Monocytes Relative: 4.8 % (ref 3.0–12.0)
NEUTROS PCT: 58.1 % (ref 43.0–77.0)
Neutro Abs: 4 10*3/uL (ref 1.4–7.7)
Platelets: 317 10*3/uL (ref 150.0–400.0)
RBC: 4.73 Mil/uL (ref 3.87–5.11)
RDW: 14.1 % (ref 11.5–14.6)
WBC: 6.8 10*3/uL (ref 4.5–10.5)

## 2013-12-01 LAB — TSH: TSH: 0.32 u[IU]/mL — ABNORMAL LOW (ref 0.35–5.50)

## 2013-12-01 NOTE — Patient Instructions (Signed)
We'll notify you of your lab results and make any changes if needed Continue to eat a low salt diet INCREASE YOUR WATER INTAKE!!! If everything looks good on the labs, we'll start a low dose water pill to improve the swelling You're BP looks great!!!  This has nothing to do w/ your blood pressure! Call with any questions or concerns Hang in there!

## 2013-12-01 NOTE — Progress Notes (Signed)
Pre visit review using our clinic review tool, if applicable. No additional management support is needed unless otherwise documented below in the visit note. 

## 2013-12-01 NOTE — Assessment & Plan Note (Signed)
New.  Suspect this is multifactorial- high salt in diet, limited activity, low water intake.  No evidence of HTN, DVT.  Check labs to r/o metabolic abnormality.  Encouraged increased hydration, low salt diet.  If labs look good, will start low dose lasix to improve swelling.  Will follow.

## 2013-12-01 NOTE — Progress Notes (Signed)
   Subjective:    Patient ID: Cheyenne Larsen, female    DOB: November 26, 1970, 43 y.o.   MRN: [id number]  HPI Edema- a week ago was unable to wear reading ring due to swelling.  Pt feels this has been worsening over the last few days- now in legs bilaterally w/ calf tightness.  Worsens as the day goes on but does not improve overnight.  Has been eating a high salt diet- soups, deli meats, hot dogs.  No new meds.  No SOB.  Mild nausea.     Review of Systems For ROS see HPI     Objective:   Physical Exam  Vitals reviewed. Constitutional: She is oriented to person, place, and time. She appears well-developed and well-nourished. No distress.  HENT:  Head: Normocephalic and atraumatic.  Eyes: Conjunctivae and EOM are normal. Pupils are equal, round, and reactive to light.  Neck: Normal range of motion. Neck supple. No thyromegaly present.  Cardiovascular: Normal rate, regular rhythm, normal heart sounds and intact distal pulses.   No murmur heard. Pulmonary/Chest: Effort normal and breath sounds normal. No respiratory distress.  Abdominal: Soft. She exhibits no distension. There is no tenderness.  Musculoskeletal: She exhibits edema (swelling of upper and lower extremities bilaterally w/o pitting).  Lymphadenopathy:    She has no cervical adenopathy.  Neurological: She is alert and oriented to person, place, and time.  Skin: Skin is warm and dry.  Psychiatric: She has a normal mood and affect. Her behavior is normal.          Assessment & Plan:

## 2013-12-04 ENCOUNTER — Encounter: Payer: Self-pay | Admitting: General Practice

## 2013-12-04 LAB — T3, FREE: T3, Free: 3.1 pg/mL (ref 2.3–4.2)

## 2013-12-04 LAB — T4, FREE: FREE T4: 0.77 ng/dL (ref 0.60–1.60)

## 2013-12-06 ENCOUNTER — Telehealth: Payer: Self-pay | Admitting: Family Medicine

## 2013-12-06 NOTE — Telephone Encounter (Signed)
Patient wanted to know what the next steps were regarding her abnormal TSH.  She also had T3 and T4 drawn.  Both were normal.  Patient was informed that at this point we will just continue to monitor her.  She stated understanding and no further questions were voiced.

## 2013-12-06 NOTE — Telephone Encounter (Signed)
Patient called to go over lab results with a nurse. Please advise

## 2014-04-20 ENCOUNTER — Encounter: Payer: Self-pay | Admitting: Medical

## 2014-04-20 ENCOUNTER — Ambulatory Visit (INDEPENDENT_AMBULATORY_CARE_PROVIDER_SITE_OTHER): Payer: Managed Care, Other (non HMO) | Admitting: Medical

## 2014-04-20 VITALS — BP 118/60 | HR 84 | Temp 98.3°F | Wt 170.0 lb

## 2014-04-20 DIAGNOSIS — R197 Diarrhea, unspecified: Secondary | ICD-10-CM

## 2014-04-20 DIAGNOSIS — R1084 Generalized abdominal pain: Secondary | ICD-10-CM

## 2014-04-20 DIAGNOSIS — K219 Gastro-esophageal reflux disease without esophagitis: Secondary | ICD-10-CM

## 2014-04-20 DIAGNOSIS — R109 Unspecified abdominal pain: Secondary | ICD-10-CM

## 2014-04-20 DIAGNOSIS — N390 Urinary tract infection, site not specified: Secondary | ICD-10-CM

## 2014-04-20 DIAGNOSIS — R8290 Unspecified abnormal findings in urine: Secondary | ICD-10-CM

## 2014-04-20 DIAGNOSIS — R82998 Other abnormal findings in urine: Secondary | ICD-10-CM

## 2014-04-20 LAB — POCT URINALYSIS DIPSTICK
BILIRUBIN UA: NEGATIVE
GLUCOSE UA: NEGATIVE
Ketones, UA: NEGATIVE
NITRITE UA: POSITIVE
Spec Grav, UA: 1.02
Urobilinogen, UA: NEGATIVE
pH, UA: 6

## 2014-04-20 LAB — CBC WITH DIFFERENTIAL/PLATELET
BASOS ABS: 0 10*3/uL (ref 0.0–0.1)
Basophils Relative: 0.5 % (ref 0.0–3.0)
Eosinophils Absolute: 0.1 10*3/uL (ref 0.0–0.7)
Eosinophils Relative: 1.3 % (ref 0.0–5.0)
HCT: 38 % (ref 36.0–46.0)
Hemoglobin: 12.5 g/dL (ref 12.0–15.0)
LYMPHS PCT: 29 % (ref 12.0–46.0)
Lymphs Abs: 2.1 10*3/uL (ref 0.7–4.0)
MCHC: 32.9 g/dL (ref 30.0–36.0)
MCV: 82.3 fl (ref 78.0–100.0)
Monocytes Absolute: 0.6 10*3/uL (ref 0.1–1.0)
Monocytes Relative: 8.2 % (ref 3.0–12.0)
NEUTROS ABS: 4.4 10*3/uL (ref 1.4–7.7)
Neutrophils Relative %: 61 % (ref 43.0–77.0)
Platelets: 330 10*3/uL (ref 150.0–400.0)
RBC: 4.62 Mil/uL (ref 3.87–5.11)
RDW: 14.1 % (ref 11.5–15.5)
WBC: 7.3 10*3/uL (ref 4.0–10.5)

## 2014-04-20 LAB — POCT URINE PREGNANCY: Preg Test, Ur: NEGATIVE

## 2014-04-20 MED ORDER — OMEPRAZOLE 20 MG PO CPDR: 20.0000 mg | DELAYED_RELEASE_CAPSULE | Freq: Every day | ORAL | Status: DC

## 2014-04-20 MED ORDER — CIPROFLOXACIN HCL 500 MG PO TABS: 500.0000 mg | ORAL_TABLET | Freq: Two times a day (BID) | ORAL | Status: DC

## 2014-04-20 NOTE — Assessment & Plan Note (Signed)
Suprapubic likely from uti. But recent 2 weeks of diarrhea so will get stool studies. Regarding her rt lower quadrant region pain. Neg urine hcg. Did get cbc today. I educated pt on  possible appendicitis. No heal jar pain. If symptoms worsen as described over weekend then ED evaluation for probable CT. I do not think that is indicated today.

## 2014-04-20 NOTE — Progress Notes (Signed)
Pre visit review using our clinic review tool, if applicable. No additional management support is needed unless otherwise documented below in the visit note. 

## 2014-04-20 NOTE — Progress Notes (Signed)
   Subjective:    Patient ID: Cheyenne Larsen, female    DOB: 08/28/1970, 43 y.o.   MRN: [id number]  HPI  Pt in with 2-3 weeksof lower abdomen and upper abdomen pain. Pain varies. Mostly on and off. Occasional pain after eating upper/epigastric region. Some does not have to eat. On and off loose stools. Loose stools that come and go. Not every day. The last couple of days watery stools. No fever or chills.  No IBS and no inflmatory bowel disease.  Pt states has some hx of reflux. Pt states more than a year since been on any mediations for reflux.  No dysuria. Noticed some cloudy urine and some odor to urine past 6 days. She is urinating more frequent urination. Some rt lower back pain. Points to rt cva area. No hx of urinary tract infections.  LMP- 2 years ago uterine ablation.(But some rt adenexal region pain on exam today)    Review of Systems  Constitutional: Negative for fever, chills and fatigue.  Respiratory: Negative for cough, chest tightness and wheezing.   Cardiovascular: Negative for chest pain and palpitations.  Gastrointestinal: Positive for nausea, abdominal pain and diarrhea. Negative for vomiting, constipation, blood in stool and anal bleeding.       Nausea occasional but none recently.  Genitourinary: Positive for frequency. Negative for dysuria, urgency, hematuria, flank pain, decreased urine volume, vaginal pain and pelvic pain.  Musculoskeletal: Negative for back pain.  Skin: Negative.  Negative for pallor, rash and wound.  Hematological: Negative for adenopathy. Does not bruise/bleed easily.       Objective:   Physical Exam  General Appearance- Not in acute distress.  HEENT Eyes- Scleraeral/Conjuntiva-bilat- Not Yellow. Mouth & Throat- Normal.  Chest and Lung Exam Auscultation: Breath sounds:-Normal. Adventitious sounds:- No Adventitious sounds, Clear, even and unlabored.  Cardiovascular Auscultation:Rythm - Regular. Heart Sounds -Normal heart  sounds.  Abdomen Inspection:-Inspection Normal.  Palpation: Palpation- Mild Tender to  rtside of umbilicus(no hernia felt, no masses), No Rebound tenderness, No rigidity(Guarding) and No Palpable abdominal masses.  Liver:-Normal.  Spleen:- Normal.  Suprapubic tenderness moderate. Rt adenxal/rlq region mild tender. No heal jar pain.  Back- no cva tenderness  Pt ambulate well with no rlq pain.        Assessment & Plan:

## 2014-04-20 NOTE — Assessment & Plan Note (Signed)
Mild symptoms intermittently epigastric after eating. No epigastric or RUQ pain on exam today. Will rx omeprazole.

## 2014-04-20 NOTE — Patient Instructions (Signed)
For your diarrhea/loose stools recommend bland diet and hydrate. Use imodium otc if needed.Please do stool studies. For your uti I prescribed cipro antibiotic. Use probiotics while on the cipro. Please get lab done today. In event of worsening rt lower quadrant region pain then go to ED in event of possible but unlikely appendicitis. This rt lower quadrant pain presently may be associated with urinary tract infection. Follow up in 7 days or as needed.

## 2014-04-26 LAB — URINE CULTURE

## 2014-05-18 LAB — HM MAMMOGRAPHY

## 2014-05-22 ENCOUNTER — Encounter: Payer: Self-pay | Admitting: Family Medicine

## 2014-06-13 ENCOUNTER — Encounter: Payer: Self-pay | Admitting: Medical

## 2014-06-13 ENCOUNTER — Telehealth: Payer: Self-pay | Admitting: Family Medicine

## 2014-06-13 ENCOUNTER — Ambulatory Visit (INDEPENDENT_AMBULATORY_CARE_PROVIDER_SITE_OTHER): Payer: Managed Care, Other (non HMO) | Admitting: Medical

## 2014-06-13 VITALS — BP 125/94 | HR 84 | Temp 99.4°F | Ht 65.0 in | Wt 175.0 lb

## 2014-06-13 DIAGNOSIS — M79622 Pain in left upper arm: Secondary | ICD-10-CM

## 2014-06-13 DIAGNOSIS — M25529 Pain in unspecified elbow: Secondary | ICD-10-CM | POA: Insufficient documentation

## 2014-06-13 DIAGNOSIS — M25522 Pain in left elbow: Secondary | ICD-10-CM

## 2014-06-13 MED ORDER — TRAMADOL HCL 50 MG PO TABS: 50.0000 mg | ORAL_TABLET | Freq: Four times a day (QID) | ORAL | Status: DC | PRN

## 2014-06-13 MED ORDER — DICLOFENAC SODIUM 75 MG PO TBEC: 75.0000 mg | DELAYED_RELEASE_TABLET | Freq: Two times a day (BID) | ORAL | Status: DC

## 2014-06-13 MED ORDER — HYDROXYZINE HCL 25 MG PO TABS: 25.0000 mg | ORAL_TABLET | Freq: Three times a day (TID) | ORAL | Status: DC | PRN

## 2014-06-13 NOTE — Telephone Encounter (Signed)
Patient Information:  Caller Name: Maudie Mercury  Phone: [phone]  Patient: Cheyenne Larsen  Gender: Female  DOB: 1971/02/22  Age: 43 Years  PCP: Midge Minium  Pregnant: No  Office Follow Up:  Does the office need to follow up with this patient?: No  Instructions For The Office: N/A   Symptoms  Reason For Call & Symptoms: Pt received the flu shot at work at 11:00 today. Now her left arm is numb 20 - 30 minutes later and tingling in her hand.. She can move her arm just not like usual and she can feel parts of her arm but they feel numb. RN called the back line and spoke with Gaye who said they would see the pt at 05:45 today. Pt aware.  Reviewed Health History In EMR: Yes  Reviewed Medications In EMR: Yes  Reviewed Allergies In EMR: Yes  Reviewed Surgeries / Procedures: Yes  Date of Onset of Symptoms: 06/13/2014 OB / GYN:  LMP: Unknown  Guideline(s) Used:  Immunization Reactions  Disposition Per Guideline:   Go to ED Now (or to Office with PCP Approval)  Reason For Disposition Reached:   Sounds like a severe, unusual reaction to the triager  Advice Given:  N/A  RN Overrode Recommendation:  Follow Up With Office Later  Gaye, nurse at the office scheduled pt at 15:45 after checking with the MD.

## 2014-06-13 NOTE — Patient Instructions (Addendum)
You appear to have neuropathic pain from the fluvaccine.   You have mild weakness of your left hand grip associated with pain directly from injection site that radiates downard to hand but flexion and extension strength is normal. If your upper extremity symptoms worsen then ED evaluation. Or if you get any other neurologic type symptoms also be seen at emergency department.  Any question or concerns please call us at any point.  Follow up on Friday or as needed.  I am prescribing diclofenac for inflamation, hydroxyzine for possibe allergic reaction, and tramadol for pain.

## 2014-06-13 NOTE — Progress Notes (Signed)
Subjective:    Patient ID: Cheyenne Larsen, female    DOB: 20-Dec-1970, 43 y.o.   MRN: [id number]  HPI  Pt in with flu vaccine today this morning. Lt arm shoulder region. Pain states pain and tingles and sharp pain that shoots all the way down to her hand(Occurred 30 minutes after injection). This injection was at 11 am.  Pt has no history of allergic reaction to vaccines. No carpal tunnel syndrome. Pt blood pressure good today. No nausea, no vomiting. No blurred d vision. No other left sided symptoms. Pain level is about 4 level pain and states constant throb sensation.  No shortness of breath or wheezing. No bruising or rash to her arm. No reported swelling.  Past Medical History  Diagnosis Date  . Heavy menses     History   Social History  . Marital Status: Single    Spouse Name: N/A    Number of Children: N/A  . Years of Education: N/A   Occupational History  . Not on file.   Social History Main Topics  . Smoking status: Never Smoker   . Smokeless tobacco: Not on file  . Alcohol Use: No  . Drug Use: No  . Sexual Activity: Not on file   Other Topics Concern  . Not on file   Social History Narrative  . No narrative on file    Past Surgical History  Procedure Laterality Date  . Cervical ablation  02/2012    Family History  Problem Relation Age of Onset  . Hypertension Mother   . Hypertension Father   . Diabetes Father     type 2  . Glaucoma Father   . Heart disease Sister   . Heart attack Maternal Grandmother   . Cancer Maternal Grandfather     bone  . Cancer Paternal Grandmother     ovarian  . Alzheimer's disease Paternal Grandfather     No Known Allergies  Current Outpatient Prescriptions on File Prior to Visit  Medication Sig Dispense Refill  . Multiple Vitamins-Minerals (MULTIVITAMIN WITH MINERALS) tablet Take 1 tablet by mouth daily.      . naproxen (NAPROSYN) 500 MG tablet Take 1 tablet (500 mg total) by mouth 2 (two) times daily with  a meal.  60 tablet  0  . omeprazole (PRILOSEC) 20 MG capsule Take 1 capsule (20 mg total) by mouth daily.  30 capsule  3   No current facility-administered medications on file prior to visit.    BP 125/94  Pulse 84  Temp(Src) 99.4 F (37.4 C) (Oral)  Ht 5\' 5"  (1.651 m)  Wt 175 lb (79.379 kg)  BMI 29.12 kg/m2  SpO2 99%       Review of Systems  Constitutional: Negative for fever, chills and fatigue.  HENT: Negative.   Respiratory: Negative for cough, choking, chest tightness, shortness of breath and wheezing.   Cardiovascular: Negative for chest pain and palpitations.  Gastrointestinal: Negative.   Genitourinary: Negative.   Musculoskeletal:       Lt arm pain lateral shoulder region point of vaccine area. Pain throbs and shoots all the way to her lt hand.   Skin: Negative for color change, pallor, rash and wound.  Neurological: Negative for dizziness, tremors, seizures, syncope, facial asymmetry, speech difficulty, light-headedness, numbness and headaches.       Lt arm pain directly from vaccine site down arm to her hand. Hand grip strength. Arm hurts when she grips.  Hematological: Negative for adenopathy. Does  not bruise/bleed easily.  Psychiatric/Behavioral: Negative.        Objective:   Physical Exam  General - No acute distress, Pleasant pt but winces in pain at times on exam. Lungs- CTA Heart- RRR Lt arm-No rash, blister, bruising, swelling or warmth. Good rom but painful shoulder abdcution and adduction. Pain directly over area where she was vaccinated. Left arm grip strength 3/5. Flexion and extenions strength 5/5. Sharp and dull discrimination intact. Normal pulses. Normal capillary refill. I think temperature of her palms are the same. Pt thinks lt palm cooler than rt side. Pt is rt handed. Neurologic- CN III-XII grossly intact. Negative romberg. No gross motor or sensory deficits. Only slight decreased grip strength(This seems related to pain radiating from  vaccine injection site.)         Assessment & Plan:

## 2014-06-13 NOTE — Telephone Encounter (Signed)
CAN contacted office for ED disposition or to get patient seen today for a reaction to a flu vaccine. Arm swollen and tingling to her fingers. Can move her arm but not well. Placed on PA schedule for 345 today

## 2014-06-13 NOTE — Progress Notes (Signed)
Pre visit review using our clinic review tool, if applicable. No additional management support is needed unless otherwise documented below in the visit note. 

## 2014-06-13 NOTE — Assessment & Plan Note (Signed)
Pain directly in area of vaccine that occurred 30 minutes after the vaccine. Pain radiates down lateral aspect of her arm. For the mild pain and inflammation  rx diclofenac. For possible allergic reaction  rx hydroxyzine. For pain moderate to severe tramadol. For full description of plan and advisement to patient please see AVS.

## 2014-06-13 NOTE — Telephone Encounter (Signed)
Caller name: °Relation to pt: °Call back number: °Pharmacy: ° °Reason for call:  ° °

## 2014-06-15 ENCOUNTER — Ambulatory Visit: Payer: Managed Care, Other (non HMO) | Admitting: Family Medicine

## 2014-11-15 ENCOUNTER — Telehealth: Payer: Self-pay | Admitting: Family Medicine

## 2014-11-15 NOTE — Telephone Encounter (Signed)
° °  Biometric form received from Glenetta Borg and forwarded to [employer]

## 2014-11-20 NOTE — Telephone Encounter (Signed)
Called and left detailed message for pt informing her that she will need to schedule an appt to have form filled out. Labs need to be drawn. I spoke with Jess T and she stated pt can either schedule a CPE or a 15 min f/u for cholesterol/BP. JG//CMA

## 2014-11-21 ENCOUNTER — Ambulatory Visit (INDEPENDENT_AMBULATORY_CARE_PROVIDER_SITE_OTHER): Payer: Managed Care, Other (non HMO) | Admitting: Family Medicine

## 2014-11-21 ENCOUNTER — Encounter: Payer: Self-pay | Admitting: Family Medicine

## 2014-11-21 VITALS — BP 120/80 | HR 82 | Temp 98.4°F | Resp 16 | Ht 61.0 in | Wt 179.1 lb

## 2014-11-21 DIAGNOSIS — E663 Overweight: Secondary | ICD-10-CM | POA: Diagnosis not present

## 2014-11-21 DIAGNOSIS — E669 Obesity, unspecified: Secondary | ICD-10-CM | POA: Insufficient documentation

## 2014-11-21 NOTE — Telephone Encounter (Signed)
Pt has an appt today (11/21/2014).

## 2014-11-21 NOTE — Telephone Encounter (Signed)
Form given to Jess T.

## 2014-11-21 NOTE — Progress Notes (Signed)
   Subjective:    Patient ID: Cheyenne Larsen, female    DOB: April 02, 1971, 44 y.o.   MRN: [id number]  HPI Overweight- ongoing issue for pt.  Not exercising.  Not following particular diet.  No CP, SOB, + HAs (currently being treated for glaucoma and this is a side effect of the medication), no palpitations, abd pain, N/V.   Review of Systems For ROS see HPI     Objective:   Physical Exam  Constitutional: She is oriented to person, place, and time. She appears well-developed and well-nourished. No distress.  HENT:  Head: Normocephalic and atraumatic.  Eyes: Conjunctivae and EOM are normal. Pupils are equal, round, and reactive to light.  Neck: Normal range of motion. Neck supple. No thyromegaly present.  Cardiovascular: Normal rate, regular rhythm, normal heart sounds and intact distal pulses.   No murmur heard. Pulmonary/Chest: Effort normal and breath sounds normal. No respiratory distress.  Abdominal: Soft. She exhibits no distension. There is no tenderness.  Musculoskeletal: She exhibits no edema.  Lymphadenopathy:    She has no cervical adenopathy.  Neurological: She is alert and oriented to person, place, and time.  Skin: Skin is warm and dry.  Psychiatric: She has a normal mood and affect. Her behavior is normal.  Vitals reviewed.         Assessment & Plan:

## 2014-11-21 NOTE — Patient Instructions (Signed)
Schedule your complete physical in 6 months We'll notify you of your lab results and make any changes if needed Try and make healthy food choices and get regular exercise Call with any questions or concerns [place]!!!

## 2014-11-21 NOTE — Assessment & Plan Note (Signed)
New.  Discussed need for healthy diet and regular exercise.  Check labs to risk stratify.  Will follow.

## 2014-11-21 NOTE — Progress Notes (Signed)
Pre visit review using our clinic review tool, if applicable. No additional management support is needed unless otherwise documented below in the visit note. 

## 2014-11-22 ENCOUNTER — Encounter: Payer: Self-pay | Admitting: General Practice

## 2014-11-22 LAB — HEPATIC FUNCTION PANEL
ALK PHOS: 60 U/L (ref 39–117)
ALT: 13 U/L (ref 0–35)
AST: 13 U/L (ref 0–37)
Albumin: 4 g/dL (ref 3.5–5.2)
BILIRUBIN TOTAL: 0.2 mg/dL (ref 0.2–1.2)
Bilirubin, Direct: 0.1 mg/dL (ref 0.0–0.3)
Total Protein: 7 g/dL (ref 6.0–8.3)

## 2014-11-22 LAB — LIPID PANEL
CHOLESTEROL: 165 mg/dL (ref 0–200)
HDL: 38.8 mg/dL — ABNORMAL LOW (ref >39.00)
LDL Cholesterol: 99 mg/dL (ref 0–99)
NONHDL: 126.2
Total CHOL/HDL Ratio: 4
Triglycerides: 137 mg/dL (ref 0.0–149.0)
VLDL: 27.4 mg/dL (ref 0.0–40.0)

## 2014-11-22 LAB — BASIC METABOLIC PANEL WITH GFR
BUN: 10 mg/dL (ref 6–23)
CO2: 24 meq/L (ref 19–32)
Calcium: 9.4 mg/dL (ref 8.4–10.5)
Chloride: 106 meq/L (ref 96–112)
Creatinine, Ser: 0.75 mg/dL (ref 0.40–1.20)
GFR: 107.38 mL/min
Glucose, Bld: 98 mg/dL (ref 70–99)
Potassium: 3.9 meq/L (ref 3.5–5.1)
Sodium: 138 meq/L (ref 135–145)

## 2014-11-22 LAB — TSH: TSH: 1.58 u[IU]/mL (ref 0.35–4.50)

## 2014-11-22 LAB — HEMOGLOBIN A1C: Hgb A1c MFr Bld: 5.1 % (ref 4.6–6.5)

## 2015-04-24 ENCOUNTER — Telehealth: Payer: Self-pay | Admitting: Family Medicine

## 2015-04-24 NOTE — Telephone Encounter (Signed)
Mix up with this pt surrounding rescheduling due to provider schedule change for 05/24/15. Pt was rescheduled for 9/1 but left a msg about cancelling the appt. Pt states msg was reiterating to cancel 05/24/15 appt. We cancelled the 9/1 appt because it was the only one then on the pts schedule. I rescheduled pt to 04/26/15 11:00am due to our error. Is this ok?

## 2015-04-24 NOTE — Telephone Encounter (Signed)
It is already scheduled.  Will not change at this time

## 2015-04-25 ENCOUNTER — Encounter: Payer: Managed Care, Other (non HMO) | Admitting: Family Medicine

## 2015-04-26 ENCOUNTER — Ambulatory Visit (INDEPENDENT_AMBULATORY_CARE_PROVIDER_SITE_OTHER): Payer: Managed Care, Other (non HMO) | Admitting: Family Medicine

## 2015-04-26 ENCOUNTER — Telehealth: Payer: Self-pay | Admitting: Family Medicine

## 2015-04-26 ENCOUNTER — Ambulatory Visit (HOSPITAL_BASED_OUTPATIENT_CLINIC_OR_DEPARTMENT_OTHER)
Admission: RE | Admit: 2015-04-26 | Discharge: 2015-04-26 | Disposition: A | Discharge status: Home / Self Care | Payer: Managed Care, Other (non HMO) | Source: Ambulatory Visit | Attending: Family Medicine | Admitting: Family Medicine

## 2015-04-26 ENCOUNTER — Encounter: Payer: Self-pay | Admitting: Family Medicine

## 2015-04-26 ENCOUNTER — Other Ambulatory Visit: Payer: Self-pay | Admitting: Family Medicine

## 2015-04-26 VITALS — BP 124/84 | HR 81 | Temp 98.7°F | Resp 18 | Ht 60.0 in | Wt 181.2 lb

## 2015-04-26 DIAGNOSIS — R109 Unspecified abdominal pain: Secondary | ICD-10-CM | POA: Insufficient documentation

## 2015-04-26 DIAGNOSIS — E669 Obesity, unspecified: Secondary | ICD-10-CM

## 2015-04-26 DIAGNOSIS — N390 Urinary tract infection, site not specified: Secondary | ICD-10-CM | POA: Diagnosis not present

## 2015-04-26 DIAGNOSIS — R1031 Right lower quadrant pain: Secondary | ICD-10-CM | POA: Diagnosis not present

## 2015-04-26 DIAGNOSIS — Z9889 Other specified postprocedural states: Secondary | ICD-10-CM | POA: Insufficient documentation

## 2015-04-26 DIAGNOSIS — D259 Leiomyoma of uterus, unspecified: Secondary | ICD-10-CM | POA: Insufficient documentation

## 2015-04-26 DIAGNOSIS — N3000 Acute cystitis without hematuria: Secondary | ICD-10-CM

## 2015-04-26 DIAGNOSIS — R609 Edema, unspecified: Secondary | ICD-10-CM

## 2015-04-26 LAB — HEPATIC FUNCTION PANEL
ALBUMIN: 4.1 g/dL (ref 3.5–5.2)
ALT: 15 U/L (ref 0–35)
AST: 13 U/L (ref 0–37)
Alkaline Phosphatase: 59 U/L (ref 39–117)
BILIRUBIN TOTAL: 0.4 mg/dL (ref 0.2–1.2)
Bilirubin, Direct: 0.1 mg/dL (ref 0.0–0.3)
Total Protein: 7.2 g/dL (ref 6.0–8.3)

## 2015-04-26 LAB — CBC WITH DIFFERENTIAL/PLATELET
BASOS PCT: 0.5 % (ref 0.0–3.0)
Basophils Absolute: 0 10*3/uL (ref 0.0–0.1)
EOS ABS: 0.2 10*3/uL (ref 0.0–0.7)
Eosinophils Relative: 2.2 % (ref 0.0–5.0)
HEMATOCRIT: 38.1 % (ref 36.0–46.0)
HEMOGLOBIN: 12.5 g/dL (ref 12.0–15.0)
LYMPHS PCT: 32.1 % (ref 12.0–46.0)
Lymphs Abs: 2.2 10*3/uL (ref 0.7–4.0)
MCHC: 32.8 g/dL (ref 30.0–36.0)
MCV: 82.1 fl (ref 78.0–100.0)
Monocytes Absolute: 0.4 10*3/uL (ref 0.1–1.0)
Monocytes Relative: 5.3 % (ref 3.0–12.0)
Neutro Abs: 4 10*3/uL (ref 1.4–7.7)
Neutrophils Relative %: 59.9 % (ref 43.0–77.0)
Platelets: 358 10*3/uL (ref 150.0–400.0)
RBC: 4.65 Mil/uL (ref 3.87–5.11)
RDW: 14.7 % (ref 11.5–15.5)
WBC: 6.7 10*3/uL (ref 4.0–10.5)

## 2015-04-26 LAB — POCT URINALYSIS DIPSTICK
Bilirubin, UA: NEGATIVE
GLUCOSE UA: NEGATIVE
Ketones, UA: NEGATIVE
NITRITE UA: POSITIVE
PH UA: 7
PROTEIN UA: NEGATIVE
RBC UA: NEGATIVE
SPEC GRAV UA: 1.015
UROBILINOGEN UA: NEGATIVE

## 2015-04-26 LAB — BASIC METABOLIC PANEL
BUN: 8 mg/dL (ref 6–23)
CO2: 26 mEq/L (ref 19–32)
CREATININE: 0.71 mg/dL (ref 0.40–1.20)
Calcium: 9 mg/dL (ref 8.4–10.5)
Chloride: 106 mEq/L (ref 96–112)
GFR: 114.17 mL/min (ref >60.00)
GLUCOSE: 71 mg/dL (ref 70–99)
Potassium: 3.8 mEq/L (ref 3.5–5.1)
Sodium: 141 mEq/L (ref 135–145)

## 2015-04-26 LAB — LIPID PANEL
CHOL/HDL RATIO: 4
Cholesterol: 168 mg/dL (ref 0–200)
HDL: 40.3 mg/dL (ref >39.00)
LDL CALC: 110 mg/dL — AB (ref 0–99)
NONHDL: 127.86
TRIGLYCERIDES: 87 mg/dL (ref 0.0–149.0)
VLDL: 17.4 mg/dL (ref 0.0–40.0)

## 2015-04-26 LAB — HEMOGLOBIN A1C: Hgb A1c MFr Bld: 4.9 % (ref 4.6–6.5)

## 2015-04-26 LAB — TSH: TSH: 1.03 u[IU]/mL (ref 0.35–4.50)

## 2015-04-26 MED ORDER — CIPROFLOXACIN HCL 500 MG PO TABS: 500.0000 mg | ORAL_TABLET | Freq: Two times a day (BID) | ORAL | Status: DC

## 2015-04-26 MED ORDER — FUROSEMIDE 20 MG PO TABS: ORAL_TABLET | ORAL | Status: DC

## 2015-04-26 NOTE — Patient Instructions (Addendum)
We'll notify you of your lab results and imaging results and determine the next steps Start the Cipro twice daily x7 days for the UTI- take w/ food Drink plenty of water to improve your swelling Continue to limit your salt intake to help w/ your swelling Take a lasix pill only as needed for swelling We'll call you with your GYN appt Call with any questions or concerns Hang in there!!!

## 2015-04-26 NOTE — Telephone Encounter (Signed)
°  Relation to PX:TGGY Call back [place]:  Reason for call: pt states she received a bill for her 11/21/14 follow-up visit, for labs requested, states billing informed her that it was a coding error as to why it was denied and to have her primary dr  recode and they can process the bill again.

## 2015-04-26 NOTE — Progress Notes (Signed)
Pre visit review using our clinic review tool, if applicable. No additional management support is needed unless otherwise documented below in the visit note. 

## 2015-04-26 NOTE — Progress Notes (Signed)
   Subjective:    Patient ID: Cheyenne Larsen, female    DOB: February 23, 1971, 44 y.o.   MRN: [id number]  HPI  Pt was scheduled for CPE but has list of complaints to address today  Vaginal bleeding- pt reports she had an endometrial ablation and since then will have intermittent severe pelvic pain and then develop vaginal bleeding.  Has GYN- [facility]ogelman.  R sided abd pain- intermittent x2 months.  Occasional associated nausea.  No vomiting.  + diarrhea, alternating w/ constipation.  Pt started fiber w/o relief.  Pt reports TTP 'all the time'.  No fevers, chills.  No known sick contacts.  Denies GERD.  Pain will start up under R ribs, radiate around to back and into pelvis.  Pt has not attempted to make a relation between eating and pain.  + family hx of gallbladder issues.  No radiation of pain to shoulder, 'i never paid any attention'.  Cloudy urine- sxs started 2 days ago.  No dysuria.  Drink 'a little bit' of fluids.  Mild back pain, R>L.  Increased urgency and frequency.  Edema- recurring problem for pt.  Typically occurs in heat or w/ wearing heels.  Pt reports 'i cut back' on salt.  Pt reports she is usually cooking at home and not eating out.  No regular exercise for over 2 months.   Review of Systems For ROS see HPI     Objective:   Physical Exam  Constitutional: She is oriented to person, place, and time. She appears well-developed and well-nourished. No distress.  HENT:  Head: Normocephalic and atraumatic.  Eyes: Conjunctivae and EOM are normal. Pupils are equal, round, and reactive to light.  Neck: Normal range of motion. Neck supple. No thyromegaly present.  Cardiovascular: Normal rate, regular rhythm, normal heart sounds and intact distal pulses.   No murmur heard. Pulmonary/Chest: Effort normal and breath sounds normal. No respiratory distress.  Abdominal: Soft. She exhibits no distension. There is tenderness (TTP over R sided abd pain). There is guarding (voluntary  guarding w/ palpation of RUQ, RLQ). There is no rebound.  Genitourinary:  Deferred to GYN  Musculoskeletal: She exhibits no edema.  Lymphadenopathy:    She has no cervical adenopathy.  Neurological: She is alert and oriented to person, place, and time.  Skin: Skin is warm and dry.  Psychiatric: She has a normal mood and affect. Her behavior is normal.  Vitals reviewed.         Assessment & Plan:

## 2015-04-26 NOTE — Telephone Encounter (Signed)
I will forward this to management as they handle our coding/billing questions.

## 2015-04-28 NOTE — Assessment & Plan Note (Signed)
New.  Pt is having bleeding in setting of ablation.  Refer back to GYN for evaluation and tx.  Pt expressed understanding and is in agreement w/ plan.

## 2015-04-28 NOTE — Assessment & Plan Note (Signed)
New to provider.  Pt reports this has been present for months.  Pt has sxs that may be consistent w/ biliary colic but she has not paid attention to relationship to food.  Pt also w/ pelvic pain and cloudy urine.  UA suspicious for UTI- start cipro empirically for infxn.  Get abd and pelvic US to assess for abnormality that could explain pain.  Check labs to r/o infxn or biliary cause.  Will determine next steps based on lab and imaging results.  Pt expressed understanding and is in agreement w/ plan.

## 2015-04-28 NOTE — Assessment & Plan Note (Signed)
Recurrent problem for pt.  Suspect this is multifactorial- obesity, poor dietary choices, very limited exercise.  No evidence of heart failure or volume overload.  Counseled on need for healthy diet and regular exercise.  Encouraged elevation as able.  Check BMP.  Start low dose lasix prn.  Pt expressed understanding and is in agreement w/ plan.

## 2015-04-28 NOTE — Assessment & Plan Note (Signed)
New.  Pt's sxs and UA consistent w/ infxn.  Start abx.  Await cx results.

## 2015-04-28 NOTE — Assessment & Plan Note (Signed)
Ongoing issue for pt.  Stressed need for healthy diet and regular exercise.  Check labs to risk stratify.  Will continue to follow. 

## 2015-04-29 LAB — URINE CULTURE

## 2015-05-01 ENCOUNTER — Telehealth: Payer: Self-pay | Admitting: Family Medicine

## 2015-05-01 NOTE — Telephone Encounter (Signed)
Pt returning your call for lab results. Best # [phone].

## 2015-05-01 NOTE — Telephone Encounter (Signed)
Spoke with pt and informed of lab results.

## 2015-05-21 LAB — HM MAMMOGRAPHY

## 2015-05-24 ENCOUNTER — Encounter: Payer: Managed Care, Other (non HMO) | Admitting: Family Medicine

## 2015-05-24 ENCOUNTER — Encounter: Payer: Self-pay | Admitting: General Practice

## 2015-05-31 ENCOUNTER — Encounter: Payer: Self-pay | Admitting: Gynecology

## 2015-05-31 ENCOUNTER — Ambulatory Visit (INDEPENDENT_AMBULATORY_CARE_PROVIDER_SITE_OTHER): Payer: Managed Care, Other (non HMO) | Admitting: Gynecology

## 2015-05-31 ENCOUNTER — Other Ambulatory Visit (HOSPITAL_COMMUNITY)
Admission: RE | Admit: 2015-05-31 | Discharge: 2015-05-31 | Disposition: A | Discharge status: Home / Self Care | Payer: Managed Care, Other (non HMO) | Source: Ambulatory Visit | Attending: Gynecology | Admitting: Gynecology

## 2015-05-31 VITALS — BP 124/76 | Ht 60.0 in | Wt 181.0 lb

## 2015-05-31 DIAGNOSIS — Z01419 Encounter for gynecological examination (general) (routine) without abnormal findings: Secondary | ICD-10-CM | POA: Diagnosis present

## 2015-05-31 DIAGNOSIS — Z1151 Encounter for screening for human papillomavirus (HPV): Secondary | ICD-10-CM | POA: Insufficient documentation

## 2015-05-31 DIAGNOSIS — N857 Hematometra: Secondary | ICD-10-CM

## 2015-05-31 DIAGNOSIS — D252 Subserosal leiomyoma of uterus: Secondary | ICD-10-CM | POA: Diagnosis not present

## 2015-05-31 DIAGNOSIS — R102 Pelvic and perineal pain: Secondary | ICD-10-CM

## 2015-05-31 NOTE — Telephone Encounter (Signed)
Forwarded message to coders to see if another DX would support visit, will follow up with patient once this has been reviewed

## 2015-05-31 NOTE — Patient Instructions (Signed)
Laparoscopically Assisted Vaginal Hysterectomy A laparoscopically assisted vaginal hysterectomy (LAVH) is a surgical procedure to remove the uterus and cervix, and sometimes the ovaries and fallopian tubes. During an LAVH, some of the surgical removal is done through the vagina, and the rest is done through a few small surgical cuts (incisions) in the abdomen.  This procedure is usually considered in women when a vaginal hysterectomy is not an option. Your health care provider will discuss the risks and benefits of the different surgical techniques at your appointment. Generally, recovery time is faster and there are fewer complications after laparoscopic procedures than after open incisional procedures. LET YOUR HEALTH CARE PROVIDER KNOW ABOUT:   Any allergies you have.  All medicines you are taking, including vitamins, herbs, eye drops, creams, and over-the-counter medicines.  Previous problems you or members of your family have had with the use of anesthetics.  Any blood disorders you have.  Previous surgeries you have had.  Medical conditions you have. RISKS AND COMPLICATIONS Generally, this is a safe procedure. However, as with any procedure, complications can occur. Possible complications include:  Allergies to medicines.  Difficulty breathing.  Bleeding.  Infection.  Damage to other structures near your uterus and cervix. BEFORE THE PROCEDURE  Ask your health care provider about changing or stopping your regular medicines.  Take certain medicines, such as a colon-emptying preparation, as directed.  Do not eat or drink anything for at least 8 hours before your surgery.  Stop smoking if you smoke. Stopping will improve your health after surgery.  Arrange for a ride home after surgery and for help at home during recovery. PROCEDURE   An IV tube will be put into one of your veins in order to give you fluids and medicines.  You will receive medicines to relax you and  medicines that make you sleep (general anesthetic).  You may have a flexible tube (catheter) put into your bladder to drain urine.  You may have a tube put through your nose or mouth that goes into your stomach (nasogastric tube). The nasogastric tube removes digestive fluids and prevents you from feeling nauseated and from vomiting.  Tight-fitting (compression) stockings will be placed on your legs to promote circulation.  Three to four small incisions will be made in your abdomen. An incision also will be made in your vagina. Probes and tools will be inserted into the small incisions. The uterus and cervix are removed (and possibly your ovaries and fallopian tubes) through your vagina as well as through the small incisions that were made in the abdomen.  Your vagina is then sewn back to normal. AFTER THE PROCEDURE  You may have a liquid diet temporarily. You will most likely return to, and tolerate, your usual diet the day after surgery.  You will be passing urine through a catheter. It will be removed the day after surgery.  Your temperature, breathing rate, heart rate, blood pressure, and oxygen level will be monitored regularly.  You will still wear compression stockings on your legs until you are able to move around.  You will use a special device or do breathing exercises to keep your lungs clear.  You will be encouraged to walk as soon as possible.   This information is not intended to replace advice given to you by your health care provider. Make sure you discuss any questions you have with your health care provider.   Document Released: 07/30/2011 Document Revised: 08/31/2014 Document Reviewed: 02/23/2013 Elsevier Interactive Patient Education 2016 Elsevier   Inc. Uterine Fibroids Uterine fibroids are tissue masses (tumors) that can develop in the womb (uterus). They are also called leiomyomas. This type of tumor is not cancerous (benign) and does not spread to other parts of  the body outside of the pelvic area, which is between the hip bones. Occasionally, fibroids may develop in the fallopian tubes, in the cervix, or on the support structures (ligaments) that surround the uterus. You can have one or many fibroids. Fibroids can vary in size, weight, and where they grow in the uterus. Some can become quite large. Most fibroids do not require medical treatment. CAUSES A fibroid can develop when a single uterine cell keeps growing (replicating). Most cells in the human body have a control mechanism that keeps them from replicating without control. SIGNS AND SYMPTOMS Symptoms may include:   Heavy bleeding during your period.  Bleeding or spotting between periods.  Pelvic pain and pressure.  Bladder problems, such as needing to urinate more often (urinary frequency) or urgently.  Inability to reproduce offspring (infertility).  Miscarriages. DIAGNOSIS Uterine fibroids are diagnosed through a physical exam. Your health care provider may feel the lumpy tumors during a pelvic exam. Ultrasonography and an MRI may be done to determine the size, location, and number of fibroids. TREATMENT Treatment may include:  Watchful waiting. This involves getting the fibroid checked by your health care provider to see if it grows or shrinks. Follow your health care provider's recommendations for how often to have this checked.  Hormone medicines. These can be taken by mouth or given through an intrauterine device (IUD).  Surgery.  Removing the fibroids (myomectomy) or the uterus (hysterectomy).  Removing blood supply to the fibroids (uterine artery embolization). If fibroids interfere with your fertility and you want to become pregnant, your health care provider may recommend having the fibroids removed.  HOME CARE INSTRUCTIONS  Keep all follow-up visits as directed by your health care provider. This is important.  Take medicines only as directed by your health care  provider.  If you were prescribed a hormone treatment, take the hormone medicines exactly as directed.  Do not take aspirin, because it can cause bleeding.  Ask your health care provider about taking iron pills and increasing the amount of dark green, leafy vegetables in your diet. These actions can help to boost your blood iron levels, which may be affected by heavy menstrual bleeding.  Pay close attention to your period and tell your health care provider about any changes, such as:  Increased blood flow that requires you to use more pads or tampons than usual per month.  A change in the number of days that your period lasts per month.  A change in symptoms that are associated with your period, such as abdominal cramping or back pain. SEEK MEDICAL CARE IF:  You have pelvic pain, back pain, or abdominal cramps that cannot be controlled with medicines.  You have an increase in bleeding between and during periods.  You soak tampons or pads in a half hour or less.  You feel lightheaded, extra tired, or weak. SEEK IMMEDIATE MEDICAL CARE IF:  You faint.  You have a sudden increase in pelvic pain.   This information is not intended to replace advice given to you by your health care provider. Make sure you discuss any questions you have with your health care provider.   Document Released: 08/07/2000 Document Revised: 08/31/2014 Document Reviewed: 02/06/2014 Elsevier Interactive Patient Education [employer].

## 2015-05-31 NOTE — Progress Notes (Signed)
Cheyenne Larsen is an 44 y.o. female who presented to the office today as a new patient to the practice in referral also from her PCP Dr. Birdie Riddle. Patient with excruciating pelvic pains that occur every several weeks. We'll do without dark blood being past her the vagina. Patient has had tubal ligation after her last cesarean section but in 2013 patient had a NovaSure endometrial ablation. She's had 2 deliveries the first one was a vaginal delivery the second one was a cesarean section with tubal sterilization procedure. Patient reports no past history of any abnormal Pap smears her PCP has done her blood work. Patient had an ultrasound ordered by her PCP recently and the following was noted:   Uterus  Measurements: 9.5 x 5.6 x 5.6 cm. Heterogeneous echotexture. Small fibroids are identified. Right uterine fibroid is 3.2 x 2.4 x 1.9 cm in sub serosal in location. Mid uterine fibroid is 1.7 x 1.5 x 1.2 cm. Right fundal fibroid is pedunculated and 2.8 x 2.2 x 1.9 cm. Incidental note made of multiple nabothian cysts, largest 1.6 cm  Endometrium  Thickness: 6.9 mm. Endometrium is difficult to visualize  Right ovary  Measurements: 3.3 x 1.4 x 1.8 cm. Normal appearance/no adnexal mass.  Left ovary  Measurements: 2.8 x 1.8 x 1.3 cm. Normal appearance/no adnexal mass.  Other findings  No free fluid.  IMPRESSION: 1. Uterine fibroids. Largest is 3.2 cm. 2. Normal appearance of the ovaries. 3. Endometrium is difficult to visualize following ablation. No endometrial mass or thickening noted.   Pertinent Gynecological History: Menses: See above Bleeding: dysfunctional uterine bleeding Contraception: tubal ligation DES exposure: unknown Blood transfusions: none Sexually transmitted diseases: no past history Previous GYN Procedures: One C-section one vaginal delivery  Last mammogram: normal Date: 2016 Last pap: normal Date: 2013 OB History: G 2, P 2  Menstrual  History: Menarche age: 71 No LMP recorded. Patient has had an ablation.    Past Medical History  Diagnosis Date  . Heavy menses   . Glaucoma     Past Surgical History  Procedure Laterality Date  . Endometrial ablation    . Tubal ligation      Family History  Problem Relation Age of Onset  . Hypertension Mother   . Hypertension Father   . Diabetes Father     type 2  . Glaucoma Father   . Heart disease Sister   . Heart attack Maternal Grandmother   . Cancer Maternal Grandfather     bone  . Ovarian cancer Paternal Grandmother   . Alzheimer's disease Paternal Grandfather     Social History:  reports that she has never smoked. She does not have any smokeless tobacco history on file. She reports that she does not drink alcohol or use illicit drugs.  Allergies: No Known Allergies   (Not in a hospital admission)  REVIEW OF SYSTEMS: A ROS was performed and pertinent positives and negatives are included in the history.  GENERAL: No fevers or chills. HEENT: No change in vision, no earache, sore throat or sinus congestion. NECK: No pain or stiffness. CARDIOVASCULAR: No chest pain or pressure. No palpitations. PULMONARY: No shortness of breath, cough or wheeze. GASTROINTESTINAL: No abdominal pain, nausea, vomiting or diarrhea, melena or bright red blood per rectum. GENITOURINARY: No urinary frequency, urgency, hesitancy or dysuria. MUSCULOSKELETAL: No joint or muscle pain, no back pain, no recent trauma. DERMATOLOGIC: No rash, no itching, no lesions. ENDOCRINE: No polyuria, polydipsia, no heat or cold intolerance. No recent  change in weight. HEMATOLOGICAL: No anemia or easy bruising or bleeding. NEUROLOGIC: No headache, seizures, numbness, tingling or weakness. PSYCHIATRIC: No depression, no loss of interest in normal activity or change in sleep pattern.     Blood pressure 124/76, height 5' (1.524 m), weight 181 lb (82.101 kg).  Physical Exam:  HEENT:unremarkable Neck:Supple,  midline, no thyroid megaly, no carotid bruits Lungs:  Clear to auscultation no rhonchi's or wheezes Heart:Regular rate and rhythm, no murmurs or gallops Breast Exam: Symmetrical in appearance no palpable mass or tenderness no supraclavicular axillary lymphadenopathy Abdomen: Soft nontender Pfannenstiel scar noted Pelvic:BUS within normal limits Vagina: No lesions or discharge Cervix: No lesions or discharge Uterus: 10 week size irregular tender in the right lower quadrant Adnexa: Irregular shaped fibroid felt Extremities: No cords, no edema Rectal: Not indicated   Assessment/Plan: With 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 Also patient with multiple fibroids especially subserosal and one pedunculated the may be contributing to her pain as well. We did do an endometrial biopsy today although the uterus did not sound to may be 6 cm perhaps O is not completely in the cavity as a result of her previous ablation. I have shared this with the patient. The tissue was submitted for histological evaluation as well as a Pap smear obtained today with HPV screening. I have recommended a laparoscopic-assisted vaginal hysterectomy with bilateral salpingectomy. Patient interested in proceeding. The risks benefits and pros and cons discussed as follows:                        Patient was counseled as to the risk of surgery to include the following:  1. Infection (prohylactic antibiotics will be administered)  2. DVT/Pulmonary Embolism (prophylactic pneumo compression stockings will be used)  3.Trauma to internal organs requiring additional surgical procedure to repair any injury to     Internal organs requiring perhaps additional hospitalization days.  4.Hemmorhage requiring transfusion and blood products which carry risks such as             anaphylactic reaction, hepatitis and AIDS  Patient had received literature  information on the procedure scheduled and all her questions were answered and fully accepts all risk.   [facility] HMD3:55 PMTD@Note :

## 2015-06-04 ENCOUNTER — Telehealth: Payer: Self-pay

## 2015-06-04 NOTE — Telephone Encounter (Signed)
I called and left message for patient to call me about scheduling surgery. I told her I have checked ins benefits and would like to discuss that as well as dates for surgery. I told her first available dates 11/15,/11/29.  (Left these details per DPR access note.)

## 2015-06-04 NOTE — Telephone Encounter (Signed)
I called patient to schedule surgery. We discussed ins benefits and her estimated surgery prepayment to be due to [place] by one week prior to surgery.  Patient said she needs to think about it. Is not ready to schedule today but will call me when she is ready.

## 2015-06-06 LAB — CYTOLOGY - PAP

## 2016-02-19 ENCOUNTER — Ambulatory Visit (INDEPENDENT_AMBULATORY_CARE_PROVIDER_SITE_OTHER): Payer: Managed Care, Other (non HMO) | Admitting: Family Medicine

## 2016-02-19 ENCOUNTER — Ambulatory Visit
Admission: RE | Admit: 2016-02-19 | Discharge: 2016-02-19 | Disposition: A | Discharge status: Home / Self Care | Payer: Managed Care, Other (non HMO) | Source: Ambulatory Visit | Attending: Family Medicine | Admitting: Family Medicine

## 2016-02-19 ENCOUNTER — Encounter: Payer: Self-pay | Admitting: Family Medicine

## 2016-02-19 ENCOUNTER — Other Ambulatory Visit: Payer: Self-pay | Admitting: General Practice

## 2016-02-19 ENCOUNTER — Other Ambulatory Visit: Payer: Self-pay | Admitting: Family Medicine

## 2016-02-19 VITALS — BP 124/89 | HR 64 | Temp 99.7°F | Resp 16 | Ht 60.0 in | Wt 179.5 lb

## 2016-02-19 DIAGNOSIS — R1031 Right lower quadrant pain: Secondary | ICD-10-CM | POA: Diagnosis not present

## 2016-02-19 DIAGNOSIS — R1314 Dysphagia, pharyngoesophageal phase: Secondary | ICD-10-CM

## 2016-02-19 DIAGNOSIS — R1310 Dysphagia, unspecified: Secondary | ICD-10-CM

## 2016-02-19 DIAGNOSIS — R319 Hematuria, unspecified: Secondary | ICD-10-CM | POA: Diagnosis not present

## 2016-02-19 DIAGNOSIS — R109 Unspecified abdominal pain: Secondary | ICD-10-CM

## 2016-02-19 DIAGNOSIS — M25551 Pain in right hip: Secondary | ICD-10-CM

## 2016-02-19 DIAGNOSIS — R1319 Other dysphagia: Secondary | ICD-10-CM

## 2016-02-19 DIAGNOSIS — D259 Leiomyoma of uterus, unspecified: Secondary | ICD-10-CM

## 2016-02-19 LAB — POCT URINALYSIS DIPSTICK
BILIRUBIN UA: NEGATIVE
GLUCOSE UA: NEGATIVE
KETONES UA: NEGATIVE
Leukocytes, UA: NEGATIVE
NITRITE UA: NEGATIVE
SPEC GRAV UA: 1.025
Urobilinogen, UA: 0.2
pH, UA: 5

## 2016-02-19 LAB — BASIC METABOLIC PANEL
BUN: 10 mg/dL (ref 6–23)
CO2: 24 mEq/L (ref 19–32)
CREATININE: 0.7 mg/dL (ref 0.40–1.20)
Calcium: 9.2 mg/dL (ref 8.4–10.5)
Chloride: 107 mEq/L (ref 96–112)
GFR: 115.64 mL/min (ref >60.00)
GLUCOSE: 99 mg/dL (ref 70–99)
POTASSIUM: 4.3 meq/L (ref 3.5–5.1)
Sodium: 138 mEq/L (ref 135–145)

## 2016-02-19 LAB — CBC WITH DIFFERENTIAL/PLATELET
Basophils Absolute: 0 10*3/uL (ref 0.0–0.1)
Basophils Relative: 0.3 % (ref 0.0–3.0)
EOS ABS: 0.1 10*3/uL (ref 0.0–0.7)
EOS PCT: 1.7 % (ref 0.0–5.0)
HCT: 38.9 % (ref 36.0–46.0)
HEMOGLOBIN: 12.8 g/dL (ref 12.0–15.0)
Lymphocytes Relative: 26.9 % (ref 12.0–46.0)
Lymphs Abs: 2 10*3/uL (ref 0.7–4.0)
MCHC: 32.9 g/dL (ref 30.0–36.0)
MCV: 81 fl (ref 78.0–100.0)
MONO ABS: 0.3 10*3/uL (ref 0.1–1.0)
Monocytes Relative: 4.6 % (ref 3.0–12.0)
Neutro Abs: 4.9 10*3/uL (ref 1.4–7.7)
Neutrophils Relative %: 66.5 % (ref 43.0–77.0)
Platelets: 322 10*3/uL (ref 150.0–400.0)
RBC: 4.8 Mil/uL (ref 3.87–5.11)
RDW: 14.2 % (ref 11.5–15.5)
WBC: 7.3 10*3/uL (ref 4.0–10.5)

## 2016-02-19 LAB — HEPATIC FUNCTION PANEL
ALBUMIN: 4.1 g/dL (ref 3.5–5.2)
ALT: 12 U/L (ref 0–35)
AST: 10 U/L (ref 0–37)
Alkaline Phosphatase: 56 U/L (ref 39–117)
Bilirubin, Direct: 0.1 mg/dL (ref 0.0–0.3)
Total Bilirubin: 0.5 mg/dL (ref 0.2–1.2)
Total Protein: 6.7 g/dL (ref 6.0–8.3)

## 2016-02-19 MED ORDER — OMEPRAZOLE 40 MG PO CPDR: 40.0000 mg | DELAYED_RELEASE_CAPSULE | Freq: Every day | ORAL | Status: DC

## 2016-02-19 MED ORDER — IOPAMIDOL (ISOVUE-300) INJECTION 61%
100.0000 mL | Freq: Once | INTRAVENOUS | Status: AC | PRN
  Administered 2016-02-19: 100 mL via INTRAVENOUS

## 2016-02-19 NOTE — Progress Notes (Signed)
   Subjective:    Patient ID: Cheyenne Larsen, female    DOB: 01-Apr-1971, 45 y.o.   MRN: [id number]  HPI R abd pain- sxs started ~2 months ago.  Pain will wake her from sleep, 'it has me in tears'.  Will take 5 ibuprofen to improve pain.  Pain is R sided and radiates to groin and lower back.  + nausea.  Pain is intermittent.  Pt does report some constipation.  Pain does not improve w/ BM.  abd is TTP.  No burning w/ urination, no blood in urine.  Pt initially thought sxs were related to cardio-kickboxing but she has been doing this x5 months.  Dysphagia- pt reports sxs w/ food.  Will need to stand up and drink fluids to get food to pass.  Will also occur w/ fluids at times.     Review of Systems For ROS see HPI     Objective:   Physical Exam  Constitutional: She is oriented to person, place, and time. She appears well-developed and well-nourished. No distress.  HENT:  Head: Normocephalic and atraumatic.  Abdominal: Soft. Bowel sounds are normal. She exhibits no distension. There is tenderness (very TTP over R pelvis and RLQ w/ + TTP over McBurney's point). There is guarding (voluntary guarding). There is no rebound.  Neurological: She is alert and oriented to person, place, and time.  Skin: Skin is warm and dry.  Psychiatric: She has a normal mood and affect. Her behavior is normal. Thought content normal.  Vitals reviewed.         Assessment & Plan:

## 2016-02-19 NOTE — Progress Notes (Signed)
Pre visit review using our clinic review tool, if applicable. No additional management support is needed unless otherwise documented below in the visit note. 

## 2016-02-19 NOTE — Patient Instructions (Signed)
Follow up by phone or MyChart in 7-10 days and let me know how the swallowing is doing We'll notify you of your lab results and make any changes if needed We'll notify you of your CT scan appt and results as soon as they are available Start the Omeprazole once daily to improve the trouble swallowing If no improvement after 7-10 days, we'll send you to GI for complete evaluation Call with any questions or concerns Hang in there!!!

## 2016-02-19 NOTE — Assessment & Plan Note (Signed)
New.  Pt's pain w/ palpation is much more than I expected given her overall well appearance.  Differential includes- kidney stone, smoldering appendicitis, GYN etiology, or other abdominal etiology.  UA w/ trace blood.  Given degree and duration of pain (2 months) will proceed w/ CT scan.  Check labs.  Will determine the next steps based on results of each.  Reviewed supportive care and red flags that should prompt return.  Pt expressed understanding and is in agreement w/ plan. r

## 2016-02-19 NOTE — Assessment & Plan Note (Signed)
New.  Pt has hx of GERD but not currently taking medication.  Start PPI and monitor for improvement.  If no improvement, will refer to GI for complete evaluation.  Pt expressed understanding and is in agreement w/ plan.

## 2016-02-21 LAB — URINE CULTURE

## 2016-03-02 ENCOUNTER — Telehealth: Payer: Self-pay | Admitting: Family Medicine

## 2016-03-02 MED ORDER — MELOXICAM 15 MG PO TABS: 15.0000 mg | ORAL_TABLET | Freq: Every day | ORAL | Status: DC

## 2016-03-02 NOTE — Telephone Encounter (Signed)
Pt states that she is having a lot of abd pain and has her appt for OB on Friday, Pt asking is there anything she do or meds she could take to help with this until her appt.

## 2016-03-02 NOTE — Telephone Encounter (Signed)
[facility] for Mobic 15mg  1 tab daily- take w/ food

## 2016-03-02 NOTE — Telephone Encounter (Signed)
Called pt and LMOVM to inform that med was filled to her Local Pharmacy.

## 2016-03-02 NOTE — Telephone Encounter (Signed)
Medication filled to pharmacy as requested.  Pt informed.   

## 2016-03-06 ENCOUNTER — Other Ambulatory Visit: Payer: Self-pay | Admitting: Obstetrics & Gynecology

## 2016-03-06 LAB — HM PAP SMEAR

## 2016-03-09 LAB — CYTOLOGY - PAP

## 2016-03-27 NOTE — Patient Instructions (Addendum)
Your procedure is scheduled on:  Tuesday, March 31, 2016  Enter through the [employer] [facility] at:  10:45 AM  Pick up the phone at the desk and dial [phone].  Call this number if you have problems the morning of surgery: [phone].  Remember: Do NOT eat food:  After Midnight Monday  Do NOT drink clear liquids after:  8:00 AM morning of surgery  Take these medicines the morning of surgery with a SIP OF WATER:  Omeprazole  Do NOT wear jewelry (body piercing), metal hair clips/bobby pins, make-up, or nail polish. Do NOT wear lotions, powders, or perfumes.  You may wear deodorant. Do NOT shave for 48 hours prior to surgery. Do NOT bring valuables to the hospital. Contacts, dentures, or bridgework may not be worn into surgery.  Leave suitcase in car.  After surgery it may be brought to your room.  For patients admitted to the hospital, checkout time is 11:00 AM the day of discharge.

## 2016-03-30 ENCOUNTER — Encounter (HOSPITAL_COMMUNITY): Payer: Self-pay

## 2016-03-30 ENCOUNTER — Encounter: Payer: Self-pay | Admitting: General Practice

## 2016-03-30 ENCOUNTER — Other Ambulatory Visit: Payer: Self-pay | Admitting: Obstetrics & Gynecology

## 2016-03-30 ENCOUNTER — Other Ambulatory Visit: Payer: Self-pay

## 2016-03-30 ENCOUNTER — Other Ambulatory Visit: Payer: Self-pay | Admitting: Family Medicine

## 2016-03-30 ENCOUNTER — Encounter (HOSPITAL_COMMUNITY)
Admission: RE | Admit: 2016-03-30 | Discharge: 2016-03-30 | Disposition: A | Discharge status: Home / Self Care | Payer: 59 | Source: Ambulatory Visit | Attending: Obstetrics & Gynecology | Admitting: Obstetrics & Gynecology

## 2016-03-30 HISTORY — DX: Headache, unspecified: R51.9

## 2016-03-30 HISTORY — DX: Gastro-esophageal reflux disease without esophagitis: K21.9

## 2016-03-30 HISTORY — DX: Fatty (change of) liver, not elsewhere classified: K76.0

## 2016-03-30 HISTORY — DX: Localized edema: R60.0

## 2016-03-30 HISTORY — DX: Headache: R51

## 2016-03-30 LAB — CBC
HCT: 38 % (ref 36.0–46.0)
Hemoglobin: 13 g/dL (ref 12.0–15.0)
MCH: 27.3 pg (ref 26.0–34.0)
MCHC: 34.2 g/dL (ref 30.0–36.0)
MCV: 79.7 fL (ref 78.0–100.0)
PLATELETS: 366 10*3/uL (ref 150–400)
RBC: 4.77 MIL/uL (ref 3.87–5.11)
RDW: 14 % (ref 11.5–15.5)
WBC: 7.4 10*3/uL (ref 4.0–10.5)

## 2016-03-30 LAB — ABO/RH: ABO/RH(D): O POS

## 2016-03-30 LAB — BASIC METABOLIC PANEL
ANION GAP: 6 (ref 5–15)
BUN: 9 mg/dL (ref 6–20)
CALCIUM: 9.3 mg/dL (ref 8.9–10.3)
CO2: 26 mmol/L (ref 22–32)
Chloride: 106 mmol/L (ref 101–111)
Creatinine, Ser: 0.84 mg/dL (ref 0.44–1.00)
Glucose, Bld: 88 mg/dL (ref 65–99)
Potassium: 4 mmol/L (ref 3.5–5.1)
SODIUM: 138 mmol/L (ref 135–145)

## 2016-03-30 LAB — TYPE AND SCREEN
ABO/RH(D): O POS
ANTIBODY SCREEN: NEGATIVE

## 2016-03-31 ENCOUNTER — Ambulatory Visit (HOSPITAL_COMMUNITY): Payer: 59 | Admitting: Certified Registered Nurse Anesthetist

## 2016-03-31 ENCOUNTER — Inpatient Hospital Stay (HOSPITAL_COMMUNITY)
Admission: AD | Admit: 2016-03-31 | Discharge: 2016-04-02 | DRG: 743 | Disposition: A | Discharge status: Home / Self Care | Payer: 59 | Source: Ambulatory Visit | Attending: Obstetrics & Gynecology | Admitting: Obstetrics & Gynecology

## 2016-03-31 ENCOUNTER — Encounter (HOSPITAL_COMMUNITY)
Admission: AD | Disposition: A | Discharge status: Home / Self Care | Payer: Self-pay | Source: Ambulatory Visit | Attending: Obstetrics & Gynecology

## 2016-03-31 ENCOUNTER — Encounter (HOSPITAL_COMMUNITY): Payer: Self-pay

## 2016-03-31 DIAGNOSIS — Z90710 Acquired absence of both cervix and uterus: Secondary | ICD-10-CM | POA: Diagnosis present

## 2016-03-31 DIAGNOSIS — K219 Gastro-esophageal reflux disease without esophagitis: Secondary | ICD-10-CM | POA: Diagnosis present

## 2016-03-31 DIAGNOSIS — Z9851 Tubal ligation status: Secondary | ICD-10-CM

## 2016-03-31 DIAGNOSIS — Z98891 History of uterine scar from previous surgery: Secondary | ICD-10-CM

## 2016-03-31 DIAGNOSIS — D259 Leiomyoma of uterus, unspecified: Principal | ICD-10-CM | POA: Diagnosis present

## 2016-03-31 DIAGNOSIS — Z79899 Other long term (current) drug therapy: Secondary | ICD-10-CM

## 2016-03-31 HISTORY — PX: TOTAL ABDOMINAL HYSTERECTOMY: SHX209

## 2016-03-31 HISTORY — PX: CYSTOSCOPY: SHX5120

## 2016-03-31 HISTORY — PX: ABDOMINAL HYSTERECTOMY: SHX81

## 2016-03-31 LAB — PREGNANCY, URINE: PREG TEST UR: NEGATIVE

## 2016-03-31 SURGERY — CYSTOSCOPY
Anesthesia: General | Site: Urethra

## 2016-03-31 MED ORDER — LACTATED RINGERS IV SOLN
INTRAVENOUS | Status: DC
  Administered 2016-03-31 – 2016-04-01 (×2): via INTRAVENOUS

## 2016-03-31 MED ORDER — ROCURONIUM BROMIDE 100 MG/10ML IV SOLN
INTRAVENOUS | Status: AC
  Filled 2016-03-31: qty 1

## 2016-03-31 MED ORDER — DEXAMETHASONE SODIUM PHOSPHATE 4 MG/ML IJ SOLN
INTRAMUSCULAR | Status: AC
  Filled 2016-03-31: qty 1

## 2016-03-31 MED ORDER — ROCURONIUM BROMIDE 100 MG/10ML IV SOLN
INTRAVENOUS | Status: DC | PRN
  Administered 2016-03-31: 50 mg via INTRAVENOUS
  Administered 2016-03-31: 10 mg via INTRAVENOUS

## 2016-03-31 MED ORDER — OXYCODONE-ACETAMINOPHEN 5-325 MG PO TABS
1.0000 | ORAL_TABLET | ORAL | Status: DC | PRN
Start: 2016-03-31 — End: 2016-04-01

## 2016-03-31 MED ORDER — MENTHOL 3 MG MT LOZG
1.0000 | LOZENGE | OROMUCOSAL | Status: DC | PRN
  Administered 2016-04-01: 3 mg via ORAL
  Filled 2016-03-31: qty 9

## 2016-03-31 MED ORDER — HYDROMORPHONE HCL 1 MG/ML IJ SOLN
INTRAMUSCULAR | Status: AC
  Filled 2016-03-31: qty 1

## 2016-03-31 MED ORDER — CEFAZOLIN SODIUM-DEXTROSE 2-4 GM/100ML-% IV SOLN
2.0000 g | INTRAVENOUS | Status: AC
  Administered 2016-03-31: 2 g via INTRAVENOUS

## 2016-03-31 MED ORDER — MEPERIDINE HCL 25 MG/ML IJ SOLN: 6.2500 mg | INTRAMUSCULAR | Status: DC | PRN

## 2016-03-31 MED ORDER — FENTANYL CITRATE (PF) 250 MCG/5ML IJ SOLN
INTRAMUSCULAR | Status: AC
  Filled 2016-03-31: qty 5

## 2016-03-31 MED ORDER — FAMOTIDINE IN NACL 20-0.9 MG/50ML-% IV SOLN
20.0000 mg | INTRAVENOUS | Status: DC
  Filled 2016-03-31 (×2): qty 50

## 2016-03-31 MED ORDER — MIDAZOLAM HCL 2 MG/2ML IJ SOLN
INTRAMUSCULAR | Status: AC
  Filled 2016-03-31: qty 2

## 2016-03-31 MED ORDER — SUGAMMADEX SODIUM 200 MG/2ML IV SOLN
INTRAVENOUS | Status: DC | PRN
  Administered 2016-03-31: 162.8 mg via INTRAVENOUS

## 2016-03-31 MED ORDER — SIMETHICONE 80 MG PO CHEW: 80.0000 mg | CHEWABLE_TABLET | Freq: Four times a day (QID) | ORAL | Status: DC | PRN

## 2016-03-31 MED ORDER — VASOPRESSIN 20 UNIT/ML IV SOLN
INTRAVENOUS | Status: AC
  Filled 2016-03-31: qty 1

## 2016-03-31 MED ORDER — ESMOLOL HCL 100 MG/10ML IV SOLN
INTRAVENOUS | Status: AC
  Filled 2016-03-31: qty 10

## 2016-03-31 MED ORDER — SUGAMMADEX SODIUM 200 MG/2ML IV SOLN
INTRAVENOUS | Status: AC
  Filled 2016-03-31: qty 2

## 2016-03-31 MED ORDER — ONDANSETRON HCL 4 MG/2ML IJ SOLN: 4.0000 mg | Freq: Once | INTRAMUSCULAR | Status: DC | PRN

## 2016-03-31 MED ORDER — ESMOLOL HCL 100 MG/10ML IV SOLN
INTRAVENOUS | Status: DC | PRN
  Administered 2016-03-31 (×3): 10 mg via INTRAVENOUS

## 2016-03-31 MED ORDER — HYDROMORPHONE HCL 1 MG/ML IJ SOLN
INTRAMUSCULAR | Status: AC
  Administered 2016-03-31: 0.25 mg via INTRAVENOUS
  Filled 2016-03-31: qty 1

## 2016-03-31 MED ORDER — ONDANSETRON HCL 4 MG/2ML IJ SOLN
INTRAMUSCULAR | Status: DC | PRN
  Administered 2016-03-31: 4 mg via INTRAVENOUS

## 2016-03-31 MED ORDER — LIDOCAINE HCL (CARDIAC) 20 MG/ML IV SOLN
INTRAVENOUS | Status: DC | PRN
  Administered 2016-03-31: 100 mg via INTRAVENOUS

## 2016-03-31 MED ORDER — SCOPOLAMINE 1 MG/3DAYS TD PT72: 1.0000 | MEDICATED_PATCH | Freq: Once | TRANSDERMAL | Status: DC

## 2016-03-31 MED ORDER — LIDOCAINE HCL (CARDIAC) 20 MG/ML IV SOLN
INTRAVENOUS | Status: AC
  Filled 2016-03-31: qty 5

## 2016-03-31 MED ORDER — BUPIVACAINE HCL (PF) 0.25 % IJ SOLN
INTRAMUSCULAR | Status: AC
  Filled 2016-03-31: qty 30

## 2016-03-31 MED ORDER — BUPIVACAINE HCL (PF) 0.25 % IJ SOLN
INTRAMUSCULAR | Status: DC | PRN
  Administered 2016-03-31: 30 mL

## 2016-03-31 MED ORDER — HYDROMORPHONE HCL 1 MG/ML IJ SOLN
INTRAMUSCULAR | Status: DC | PRN
  Administered 2016-03-31 (×2): 0.5 mg via INTRAVENOUS

## 2016-03-31 MED ORDER — FAMOTIDINE IN NACL 20-0.9 MG/50ML-% IV SOLN
20.0000 mg | INTRAVENOUS | Status: DC
  Administered 2016-03-31 – 2016-04-01 (×2): 20 mg via INTRAVENOUS
  Filled 2016-03-31 (×2): qty 50

## 2016-03-31 MED ORDER — DEXAMETHASONE SODIUM PHOSPHATE 10 MG/ML IJ SOLN
INTRAMUSCULAR | Status: DC | PRN
  Administered 2016-03-31: 4 mg via INTRAVENOUS

## 2016-03-31 MED ORDER — ACETAMINOPHEN 325 MG PO TABS: 650.0000 mg | ORAL_TABLET | ORAL | Status: DC | PRN

## 2016-03-31 MED ORDER — LIDOCAINE-EPINEPHRINE 1 %-1:100000 IJ SOLN
INTRAMUSCULAR | Status: AC
  Filled 2016-03-31: qty 1

## 2016-03-31 MED ORDER — HYDROMORPHONE HCL 1 MG/ML IJ SOLN
0.2500 mg | INTRAMUSCULAR | Status: DC | PRN
  Administered 2016-03-31 (×4): 0.25 mg via INTRAVENOUS

## 2016-03-31 MED ORDER — ONDANSETRON HCL 4 MG/2ML IJ SOLN
INTRAMUSCULAR | Status: AC
  Filled 2016-03-31: qty 2

## 2016-03-31 MED ORDER — SODIUM CHLORIDE 0.9 % IJ SOLN
INTRAMUSCULAR | Status: AC
  Filled 2016-03-31: qty 100

## 2016-03-31 MED ORDER — KETOROLAC TROMETHAMINE 30 MG/ML IJ SOLN: 30.0000 mg | Freq: Four times a day (QID) | INTRAMUSCULAR | Status: DC

## 2016-03-31 MED ORDER — KETOROLAC TROMETHAMINE 30 MG/ML IJ SOLN
INTRAMUSCULAR | Status: AC
  Filled 2016-03-31: qty 1

## 2016-03-31 MED ORDER — ONDANSETRON HCL 4 MG/2ML IJ SOLN: 4.0000 mg | Freq: Four times a day (QID) | INTRAMUSCULAR | Status: DC | PRN

## 2016-03-31 MED ORDER — KETOROLAC TROMETHAMINE 30 MG/ML IJ SOLN
30.0000 mg | Freq: Four times a day (QID) | INTRAMUSCULAR | Status: DC
  Administered 2016-03-31 – 2016-04-01 (×3): 30 mg via INTRAVENOUS
  Filled 2016-03-31 (×3): qty 1

## 2016-03-31 MED ORDER — HYDROMORPHONE HCL 1 MG/ML IJ SOLN
1.0000 mg | INTRAMUSCULAR | Status: DC | PRN
  Administered 2016-03-31 – 2016-04-01 (×4): 1 mg via INTRAVENOUS
  Filled 2016-03-31 (×5): qty 1

## 2016-03-31 MED ORDER — LACTATED RINGERS IV SOLN
INTRAVENOUS | Status: DC
  Administered 2016-03-31 (×3): via INTRAVENOUS

## 2016-03-31 MED ORDER — MIDAZOLAM HCL 2 MG/2ML IJ SOLN
INTRAMUSCULAR | Status: DC | PRN
  Administered 2016-03-31: 2 mg via INTRAVENOUS

## 2016-03-31 MED ORDER — PROPOFOL 10 MG/ML IV BOLUS
INTRAVENOUS | Status: AC
  Filled 2016-03-31: qty 20

## 2016-03-31 MED ORDER — FENTANYL CITRATE (PF) 100 MCG/2ML IJ SOLN
INTRAMUSCULAR | Status: DC | PRN
  Administered 2016-03-31 (×2): 50 ug via INTRAVENOUS
  Administered 2016-03-31: 100 ug via INTRAVENOUS
  Administered 2016-03-31: 50 ug via INTRAVENOUS
  Administered 2016-03-31 (×2): 100 ug via INTRAVENOUS
  Administered 2016-03-31: 50 ug via INTRAVENOUS

## 2016-03-31 MED ORDER — PROPOFOL 10 MG/ML IV BOLUS
INTRAVENOUS | Status: DC | PRN
  Administered 2016-03-31: 180 mg via INTRAVENOUS

## 2016-03-31 MED ORDER — ONDANSETRON HCL 4 MG PO TABS: 4.0000 mg | ORAL_TABLET | Freq: Four times a day (QID) | ORAL | Status: DC | PRN

## 2016-03-31 SURGICAL SUPPLY — 67 items
CANISTER SUCT 3000ML (MISCELLANEOUS) ×5 IMPLANT
CLOTH BEACON ORANGE TIMEOUT ST (SAFETY) ×5 IMPLANT
CONT PATH 16OZ SNAP LID 3702 (MISCELLANEOUS) ×5 IMPLANT
COVER BACK TABLE 60X90IN (DRAPES) IMPLANT
COVER LIGHT HANDLE  1/PK (MISCELLANEOUS) ×2
COVER LIGHT HANDLE 1/PK (MISCELLANEOUS) ×3 IMPLANT
DECANTER SPIKE VIAL GLASS SM (MISCELLANEOUS) ×5 IMPLANT
DRAPE WARM FLUID 44X44 (DRAPE) ×5 IMPLANT
DRSG COVADERM PLUS 2X2 (GAUZE/BANDAGES/DRESSINGS) IMPLANT
DRSG OPSITE POSTOP 3X4 (GAUZE/BANDAGES/DRESSINGS) IMPLANT
DRSG OPSITE POSTOP 4X10 (GAUZE/BANDAGES/DRESSINGS) ×5 IMPLANT
DURAPREP 26ML APPLICATOR (WOUND CARE) ×5 IMPLANT
ELECT BLADE 6.5 EXT (BLADE) ×5 IMPLANT
ELECT REM PT RETURN 9FT ADLT (ELECTROSURGICAL) ×5
ELECTRODE REM PT RTRN 9FT ADLT (ELECTROSURGICAL) ×3 IMPLANT
FILTER SMOKE EVAC LAPAROSHD (FILTER) IMPLANT
GAUZE SPONGE 4X4 16PLY XRAY LF (GAUZE/BANDAGES/DRESSINGS) IMPLANT
GLOVE BIO SURGEON STRL SZ 6.5 (GLOVE) ×8 IMPLANT
GLOVE BIO SURGEON STRL SZ7 (GLOVE) ×10 IMPLANT
GLOVE BIO SURGEONS STRL SZ 6.5 (GLOVE) ×2
GLOVE BIOGEL PI IND STRL 7.0 (GLOVE) ×9 IMPLANT
GLOVE BIOGEL PI INDICATOR 7.0 (GLOVE) ×6
GOWN STRL REUS W/TWL LRG LVL3 (GOWN DISPOSABLE) ×15 IMPLANT
LEGGING LITHOTOMY PAIR STRL (DRAPES) IMPLANT
LIGASURE BLUNT 5MM 37CM (INSTRUMENTS) IMPLANT
LIGASURE IMPACT 36 18CM CVD LR (INSTRUMENTS) IMPLANT
LIQUID BAND (GAUZE/BANDAGES/DRESSINGS) IMPLANT
NEEDLE HYPO 22GX1.5 SAFETY (NEEDLE) ×5 IMPLANT
NS IRRIG 1000ML POUR BTL (IV SOLUTION) IMPLANT
PACK ABDOMINAL GYN (CUSTOM PROCEDURE TRAY) ×5 IMPLANT
PACK LAVH (CUSTOM PROCEDURE TRAY) IMPLANT
PACK ROBOTIC GOWN (GOWN DISPOSABLE) IMPLANT
PAD OB MATERNITY 4.3X12.25 (PERSONAL CARE ITEMS) ×5 IMPLANT
PAD TRENDELENBURG POSITION (MISCELLANEOUS) ×5 IMPLANT
PENCIL SMOKE EVAC W/HOLSTER (ELECTROSURGICAL) ×5 IMPLANT
PROTECTOR NERVE ULNAR (MISCELLANEOUS) ×10 IMPLANT
RTRCTR C-SECT PINK 25CM LRG (MISCELLANEOUS) ×5 IMPLANT
SCISSORS LAP 5X35 DISP (ENDOMECHANICALS) IMPLANT
SET CYSTO W/LG BORE CLAMP LF (SET/KITS/TRAYS/PACK) IMPLANT
SET IRRIG TUBING LAPAROSCOPIC (IRRIGATION / IRRIGATOR) IMPLANT
SLEEVE XCEL OPT CAN 5 100 (ENDOMECHANICALS) IMPLANT
SPONGE LAP 18X18 X RAY DECT (DISPOSABLE) ×5 IMPLANT
SUT CHROMIC 2 0 CT 1 (SUTURE) ×5 IMPLANT
SUT CHROMIC 2 0 CT 36 (SUTURE) ×5 IMPLANT
SUT CHROMIC 2 0 TIES 18 (SUTURE) IMPLANT
SUT MON AB 4-0 PS1 27 (SUTURE) IMPLANT
SUT PDS AB 0 CT 36 (SUTURE) ×10 IMPLANT
SUT PLAIN 2 0 (SUTURE)
SUT PLAIN 2 0 XLH (SUTURE) ×5 IMPLANT
SUT PLAIN ABS 2-0 54XMFL TIE (SUTURE) IMPLANT
SUT SILK 2 0 SH (SUTURE) IMPLANT
SUT VIC AB 0 CT1 27 (SUTURE) ×12
SUT VIC AB 0 CT1 27XBRD ANBCTR (SUTURE) ×9 IMPLANT
SUT VIC AB 0 CT1 27XCR 8 STRN (SUTURE) ×9 IMPLANT
SUT VIC AB 2-0 CT1 (SUTURE) ×5 IMPLANT
SUT VIC AB 4-0 KS 27 (SUTURE) IMPLANT
SUT VICRYL 0 TIES 12 18 (SUTURE) ×5 IMPLANT
SUT VICRYL 0 UR6 27IN ABS (SUTURE) IMPLANT
SYR BULB IRRIGATION 50ML (SYRINGE) ×5 IMPLANT
SYR CONTROL 10ML LL (SYRINGE) IMPLANT
TOWEL OR 17X24 6PK STRL BLUE (TOWEL DISPOSABLE) ×10 IMPLANT
TRAY FOLEY CATH SILVER 14FR (SET/KITS/TRAYS/PACK) ×5 IMPLANT
TROCAR OPTI TIP 5M 100M (ENDOMECHANICALS) IMPLANT
TROCAR XCEL NON-BLD 11X100MML (ENDOMECHANICALS) IMPLANT
TROCAR XCEL NON-BLD 5MMX100MML (ENDOMECHANICALS) IMPLANT
WARMER LAPAROSCOPE (MISCELLANEOUS) IMPLANT
WATER STERILE IRR 1000ML POUR (IV SOLUTION) ×10 IMPLANT

## 2016-03-31 NOTE — Anesthesia Procedure Notes (Signed)
Procedure Name: Intubation Date/Time: 03/31/2016 12:36 PM Performed by: Hewitt Blade Pre-anesthesia Checklist: Patient identified, Emergency Drugs available, Suction available and Patient being monitored Patient Re-evaluated:Patient Re-evaluated prior to inductionOxygen Delivery Method: Circle system utilized Preoxygenation: Pre-oxygenation with 100% oxygen Intubation Type: IV induction Ventilation: Mask ventilation without difficulty and Oral airway inserted - appropriate to patient size Laryngoscope Size: Mac and 3 Grade View: Grade I Tube type: Oral Tube size: 7.0 mm Number of attempts: 1 Airway Equipment and Method: Stylet Placement Confirmation: ETT inserted through vocal cords under direct vision,  positive ETCO2 and breath sounds checked- equal and bilateral Secured at: 21 cm Tube secured with: Tape Dental Injury: Teeth and Oropharynx as per pre-operative assessment

## 2016-03-31 NOTE — Anesthesia Preprocedure Evaluation (Signed)
Anesthesia Evaluation  Patient identified by MRN, date of birth, ID band Patient awake    Reviewed: Allergy & Precautions, NPO status , Patient's Chart, lab work & pertinent test results  Airway Mallampati: I  TM Distance: >3 FB Neck ROM: Full    Dental   Pulmonary    Pulmonary exam normal        Cardiovascular Normal cardiovascular exam     Neuro/Psych    GI/Hepatic GERD  Medicated and Controlled,  Endo/Other    Renal/GU      Musculoskeletal   Abdominal   Peds  Hematology   Anesthesia Other Findings   Reproductive/Obstetrics                             Anesthesia Physical Anesthesia Plan  ASA: II  Anesthesia Plan: General   Post-op Pain Management:    Induction: Intravenous  Airway Management Planned: Oral ETT  Additional Equipment:   Intra-op Plan:   Post-operative Plan: Extubation in OR  Informed Consent: I have reviewed the patients History and Physical, chart, labs and discussed the procedure including the risks, benefits and alternatives for the proposed anesthesia with the patient or authorized representative who has indicated his/her understanding and acceptance.     Plan Discussed with: CRNA and Surgeon  Anesthesia Plan Comments:         Anesthesia Quick Evaluation

## 2016-03-31 NOTE — Op Note (Signed)
Patient: Cheyenne Larsen   Pre-Operative Diagnosis: Symptomatic Fibroid Uterus  Postoperative diagnoses: Same as above;   Procedure: Total abdominal hysterectomy, bilateral salpingectomy  Surgeon: Dr. Sanjuana Kava  Asst.: Dr. Freda Munro  Specimen: uterus with cervix, bilateral fallopian tubes  EBL: 123XX123 mL  Complications: none.  Anesthesia:  GETA   Operative findings: Enlarged fibroid uterus; dilated abnormal appearing right fallopian tube. Normal appearing left fallopian tube. Ovaries grossly normal. Scar tissue from Anterior of uterus to bladder.  Normal bladder urothelium on cystoscopy with brisk ureteral orifice jets.   Technique:  After adequate general anesthesia was achieved, the patient was prepped and draped in the usual sterile fashion after the foley had been placed.  A Pfannenstiel incision was made with the scalpel and carried down to the fascia.  The fascia was incised with the bovie in a transverse manner and extended in a transverse curvilinear manner.  The rectus muscles were split in the midline and a bowel free portion of the peritoneum was tented up.  It was entered into with the hemostat and extended in a superior and inferior manner with good visualization of the bowel and bladder. The Alexis retractor was placed in the abdomen and the  bowel was packed away. The uterus was brought out of the pelvis by holding each cornual end with large kelly clamps. The right salpinx was clamped with a kelly clamp and transected off the ovary. The pedicle was suture ligated with 0- vicryl and then a free tie was used to secure the pedicle. This was performed bilaterally.  The round ligaments were suture ligated with 0-vicryl bilaterally and incised with the bovie.  The bovie was then used to incise a transverse curvilinear incision in the vesico-cervical fascia. The Bladder flap was created and the bladder mobilized downward. The ureters were visualized bilaterally  retroperitoneally coursing well below the operative field. The incisions were extended to parallel the IP ligaments and the uterine arteries were skeletonized.  The mesosalpinx bilaterally was incised with the bovie cautery and removed from the ovaries.  The uterine-ovarian ligaments bilaterally were then clamped with heaney clamps and incised with the mayo scissors.  Each pedicle was then secured with a free tie and then a suture ligation.  The bladder flap was then retracted inferiorly with blunt and cautery dissection below the external cervical os.  The uterine arteries were clamped bilaterally with the heaneys and incised with the mayos.  Each pedicle was then secured with a stitch of 0-vicryl.  The uterosacral cardinal ligament complex was sequentially clamped  straight heaney clamps transected with a scalpel and secured with 0-vicryl. This was done bilaterally.     At the level of the reflection of the vagina onto the cervix, two curved heaneys were placed and the cervix removed with the mayo scissory.  The cuff angles were then secured with 0-vicryl and the cuff was closed with interrupted figure of eight stitches of 0-vicryl.  Hemostasis was insured and the ureters were peristalsing well out of the field of dissection.  Irrigation was performed and hemostasis was assured.  The instruments and laps were removed from the abdominal cavity and the peritoneum was closed with a running stitch of 2-chromic. Interrupted suture of 2-0 chromic re-approximated the rectus muscle.The fascia was closed with 0-PDS in a running manner meeting from both ends in the middle. The subcutaneous layer was irrigated and all small bleeders cauterized. 20 mL of 0.25% Marcaine was injected into the Sub Cutaneous layer and it was then  reapproximated with interrupted stitches of 2-0 plain gut. The skin was closed with 3-0 vicryl sub-q with a keith needle. Benzoin, steri strips and a honeycomb bandage was placed.  Sponge, needle  and instrument counts were correct x 3. Pt tolerated the procedure well and was transferred to the recovery room in stable condition.    Cheyenne Larsen Cheyenne Larsen

## 2016-03-31 NOTE — H&P (Signed)
Cheyenne Larsen is an 45 y.o. female with known h/o  uterine fibroids. In 2013, she had an endometrial ablation which controlled the heavy menstrual bleeding she had in the past. She now has bulk symptoms and lower pelvic pain. She has dysmenorrhea monthly when it is time for her menses. She had a CT scan done (ordered by her PCP ) at the end of June. She has no h/o dysplasia. She has had a vaginal delivery and her last delivery was via c-section. She denies urinary or defecatory dysfunction   Pertinent Gynecological History: Menses: flow is light Bleeding: normal Contraception: tubal ligation DES exposure: denies Blood transfusions: none Sexually transmitted diseases: no past history Previous GYN Procedures: endometrial ablation  Last mammogram: normal Date: 2016 Last pap: normal Date: 02/2016 OB History: G2, P2   Menstrual History: Menarche age: 68 No LMP recorded. Patient has had an ablation.    Past Medical History:  Diagnosis Date  . Bilateral leg edema   . Fatty liver   . GERD (gastroesophageal reflux disease)   . Glaucoma   . Headache    Migraines  . Heavy menses     Past Surgical History:  Procedure Laterality Date  . CESAREAN SECTION    . ENDOMETRIAL ABLATION    . TUBAL LIGATION      Family History  Problem Relation Age of Onset  . Hypertension Mother   . Hypertension Father   . Diabetes Father     type 2  . Glaucoma Father   . Heart disease Sister   . Heart attack Maternal Grandmother   . Cancer Maternal Grandfather     bone  . Ovarian cancer Paternal Grandmother   . Alzheimer's disease Paternal Grandfather     Social History:  reports that she has never smoked. She has never used smokeless tobacco. She reports that she does not drink alcohol or use drugs.  Allergies: No Known Allergies  Prescriptions Prior to Admission  Medication Sig Dispense Refill Last Dose  . ibuprofen (ADVIL,MOTRIN) 200 MG tablet Take 800 mg by mouth every 6 (six) hours  as needed for mild pain.   Past Week at Unknown time  . meloxicam (MOBIC) 15 MG tablet TAKE 1 TABLET (15 MG TOTAL) BY MOUTH DAILY. WITH FOOD 30 tablet 0 Past Month at Unknown time  . omeprazole (PRILOSEC) 40 MG capsule Take 1 capsule (40 mg total) by mouth daily. 90 capsule 0 03/31/2016 at 0730  . pseudoephedrine (SUDAFED) 30 MG tablet Take 30 mg by mouth every 4 (four) hours as needed for congestion.   Past Week at Unknown time  . TRAVATAN Z 0.004 % SOLN ophthalmic solution Place 1 drop into both eyes at bedtime.  12 03/31/2016 at Unknown time  . furosemide (LASIX) 20 MG tablet 1 tab as needed for edema (Patient taking differently: Take 20 mg by mouth daily as needed for fluid or edema. ) 30 tablet 3 More than a month at Unknown time    Review of Systems  Constitutional: Negative for chills and weight loss.  HENT: Negative for hearing loss.   Cardiovascular: Negative for chest pain and palpitations.  Gastrointestinal: Negative for heartburn, nausea and vomiting.  Genitourinary: Negative for dysuria and urgency.  Skin: Negative for itching and rash.  Neurological: Negative for dizziness, tingling and headaches.  Endo/Heme/Allergies: Negative for environmental allergies. Does not bruise/bleed easily.  All other systems reviewed and are negative.   Blood pressure (!) 134/97, resp. rate 20, SpO2 100 %. Physical Exam  Nursing note and vitals reviewed. Constitutional: She appears well-developed and well-nourished.  Genitourinary:  Genitourinary Comments: In office: Vulva: no masses,  no atrophy,  no lesions  Mons: normal,  no erythema,  no excoriation,  no atrophy,  no lesions,  no vesicles/ ulcers,  no masses,  no swelling,  no tenderness  Labia Majora: normal,  no erythema,  no excoriation,  no atrophy,  no discoloration,  no lesions,  no vesicles/ ulcers,  no masses,  no swelling,  no tenderness  Labia Minora: normal,  no erythema,  no excoriation,  no atrophy,  no discoloration,  no lesions,   no vesicles,  no masses,  no swelling,  no tenderness  Introitus: normal  Bartholin's Gland: normal  Vagina: normal,  no discharge,  no blood present,  no erythema,  no atrophy,  no lesions,  no ulcers,  no swelling,  no masses,  no tenderness,  no prolapse  Cervix: grossly normal,  no lesions,  no discharge,  no bleeding,  no cervical motion tenderness,  sample taken for a Pap smear  Uterus: midline,  no uterine prolapse,  mobile,  non-tender,  contour irregular,  fibroids,  enlarged 13 weeks size  Urethral Meatus/ Urethra normal meatus,  no discharge,   well supported urethra,  no masses,  no tenderness  Bladder: non-distended,  no palpable mass,  non-tender  Adnexa/Parametria: no mass palpable,  no tenderness      Results for orders placed or performed during the hospital encounter of 03/30/16 (from the past 24 hour(s))  Basic metabolic panel     Status: None   Collection Time: 03/30/16 12:25 PM  Result Value Ref Range   Sodium 138 135 - 145 mmol/L   Potassium 4.0 3.5 - 5.1 mmol/L   Chloride 106 101 - 111 mmol/L   CO2 26 22 - 32 mmol/L   Glucose, Bld 88 65 - 99 mg/dL   BUN 9 6 - 20 mg/dL   Creatinine, Ser 0.84 0.44 - 1.00 mg/dL   Calcium 9.3 8.9 - 10.3 mg/dL   GFR calc non Af Amer >60 >60 mL/min   GFR calc Af Amer >60 >60 mL/min   Anion gap 6 5 - 15  Type and screen     Status: None   Collection Time: 03/30/16 12:25 PM  Result Value Ref Range   ABO/RH(D) O POS    Antibody Screen NEG    Sample Expiration 04/13/2016    Extend sample reason NO TRANSFUSIONS OR PREGNANCY IN THE PAST 3 MONTHS   ABO/Rh     Status: None   Collection Time: 03/30/16 12:25 PM  Result Value Ref Range   ABO/RH(D) O POS   CBC     Status: None   Collection Time: 03/30/16 12:25 PM  Result Value Ref Range   WBC 7.4 4.0 - 10.5 K/uL   RBC 4.77 3.87 - 5.11 MIL/uL   Hemoglobin 13.0 12.0 - 15.0 g/dL   HCT 38.0 36.0 - 46.0 %   MCV 79.7 78.0 - 100.0 fL   MCH 27.3 26.0 - 34.0 pg   MCHC 34.2 30.0 - 36.0 g/dL    RDW 14.0 11.5 - 15.5 %   Platelets 366 150 - 400 K/uL    No results found.  Assessment/Plan: 45 yo with symptomatic fibroid uterus, manifested by dysmenorrhea and bulk symptoms.   Her bleeding diathesis has been alleviated by novasure ablation. Patient was counseled extensively regarding management to include observation, medical management, and surgical management. I  have explained to the patient that her current problem does not need to be treated surgically and that observation could be continued but that in my experience, this problem may continue to worsen. She understands that fibroids are estrogen dependent and will continue to grow so long as she is menstrually active. She also understands that sometimes fibroids will regress after menopause. She understands, that while annoying, fibroids will not shorten her life unless it impinges bowel, affects renal function, major vascular function or causes severe anemia all of which would be a risk to her health. We discussed that fibroids do have <1% risk of malignancy. I have discussed with her medical therapy to consist of Depo Lupron injections in an attempt to down-regulate estrogen receptors and attempt to slow progression of fibroid size, and actually shrink the fibroids in size. However, we discussed that Lupron therapy, is usually temporary and is often used as a preoperative therapy.  We also discussed that she could have a Uterine Artery Embolization, and the fibroids may shrink with this procedure. She was informed that Interventional Radiology would be the service performing this procedure and we would require an updated MRI of the pelvis.   Patient desires definitive management. We extensively discussed the various routes of hysterectomy and the pros and cons of a hysterectomy. We also discussed prophylactic oophorectomy at time of the hysterectomy given her age. She understands that there is a 10-15% risk that she may need future  surgery secondary to her retained ovaries and that there is no good surveillance for ovarian CA with a 1/70 lifetime risk, yet her ovaries provide benefit with regard to bone, cardiovascular health, and overall life longevity.  The R/B of surgery were discussed w/ the patient to include, but not limited to bleeding/transfusion, infection, wound breakdown/poor healing, damage to organs in abdomen and pelvis w/ possible need for further surgical repair, need for abdominal incision to complete procedure, blood clot, PE/MI/stroke.  On call to OR for TLH, bilateral salpingectomy (possilble TAH) cystoscopy  Dimples Probus, [place] 03/31/2016, 11:15 AM

## 2016-03-31 NOTE — Transfer of Care (Signed)
Immediate Anesthesia Transfer of Care Note  Patient: Cheyenne Larsen  Procedure(s) Performed: Procedure(s): CYSTOSCOPY (N/A) HYSTERECTOMY ABDOMINAL (N/A)  Patient Location: PACU  Anesthesia Type:General  Level of Consciousness: awake, alert  and oriented  Airway & Oxygen Therapy: Patient Spontanous Breathing and Patient connected to face mask oxygen  Post-op Assessment: Report given to RN and Post -op Vital signs reviewed and stable  Post vital signs: Reviewed and stable  Last Vitals:  Vitals:   03/31/16 1516 03/31/16 1522  BP: (!) 146/78   Pulse: (!) 101 (!) 131  Resp: 12 19  Temp: 36.9 C     Last Pain:  Vitals:   03/31/16 1522  PainSc: 10-Worst pain ever         Complications: No apparent anesthesia complications

## 2016-04-01 ENCOUNTER — Encounter (HOSPITAL_COMMUNITY): Payer: Self-pay | Admitting: Obstetrics & Gynecology

## 2016-04-01 DIAGNOSIS — D259 Leiomyoma of uterus, unspecified: Secondary | ICD-10-CM | POA: Diagnosis present

## 2016-04-01 DIAGNOSIS — N946 Dysmenorrhea, unspecified: Secondary | ICD-10-CM | POA: Diagnosis present

## 2016-04-01 DIAGNOSIS — K219 Gastro-esophageal reflux disease without esophagitis: Secondary | ICD-10-CM | POA: Diagnosis present

## 2016-04-01 DIAGNOSIS — Z98891 History of uterine scar from previous surgery: Secondary | ICD-10-CM | POA: Diagnosis not present

## 2016-04-01 DIAGNOSIS — Z79899 Other long term (current) drug therapy: Secondary | ICD-10-CM | POA: Diagnosis not present

## 2016-04-01 DIAGNOSIS — Z9851 Tubal ligation status: Secondary | ICD-10-CM | POA: Diagnosis not present

## 2016-04-01 LAB — CBC
HEMATOCRIT: 34.3 % — AB (ref 36.0–46.0)
HEMOGLOBIN: 11.6 g/dL — AB (ref 12.0–15.0)
MCH: 26.8 pg (ref 26.0–34.0)
MCHC: 33.8 g/dL (ref 30.0–36.0)
MCV: 79.2 fL (ref 78.0–100.0)
Platelets: 326 10*3/uL (ref 150–400)
RBC: 4.33 MIL/uL (ref 3.87–5.11)
RDW: 13.8 % (ref 11.5–15.5)
WBC: 12.8 10*3/uL — AB (ref 4.0–10.5)

## 2016-04-01 MED ORDER — OXYCODONE-ACETAMINOPHEN 5-325 MG PO TABS
2.0000 | ORAL_TABLET | ORAL | Status: DC | PRN
  Administered 2016-04-01 (×3): 2 via ORAL
  Filled 2016-04-01 (×3): qty 2

## 2016-04-01 MED ORDER — SIMETHICONE 80 MG PO CHEW
80.0000 mg | CHEWABLE_TABLET | Freq: Four times a day (QID) | ORAL | Status: DC
  Administered 2016-04-01 (×4): 80 mg via ORAL
  Filled 2016-04-01 (×4): qty 1

## 2016-04-01 MED ORDER — IBUPROFEN 600 MG PO TABS
600.0000 mg | ORAL_TABLET | Freq: Four times a day (QID) | ORAL | Status: DC
  Administered 2016-04-01 – 2016-04-02 (×4): 600 mg via ORAL
  Filled 2016-04-01 (×4): qty 1

## 2016-04-01 NOTE — Anesthesia Postprocedure Evaluation (Signed)
Anesthesia Post Note  Patient: Cheyenne Larsen  Procedure(s) Performed: Procedure(s) (LRB): CYSTOSCOPY (N/A) HYSTERECTOMY ABDOMINAL (N/A)  Patient location during evaluation: [facility] Anesthesia Type: General Level of consciousness: awake and alert Pain management: pain level controlled Vital Signs Assessment: post-procedure vital signs reviewed and stable Respiratory status: spontaneous breathing, nonlabored ventilation and respiratory function stable Cardiovascular status: blood pressure returned to baseline and stable Postop Assessment: no signs of nausea or vomiting Anesthetic complications: no     Last Vitals:  Vitals:   04/01/16 0150 04/01/16 0513  BP: 125/69 111/74  Pulse: 84 94  Resp: 18 18  Temp: 36.7 C 36.7 C    Last Pain:  Vitals:   04/01/16 0707  TempSrc:   PainSc: Asleep   Pain Goal: Patients Stated Pain Goal: 3 (03/31/16 1945)               Riki Sheer

## 2016-04-01 NOTE — Progress Notes (Signed)
1 Day Post-Op Procedure(s) (LRB): CYSTOSCOPY (N/A) HYSTERECTOMY ABDOMINAL (N/A)  Subjective: Patient reports nausea, incisional pain and tolerating PO.    Objective: I have reviewed patient's vital signs, intake and output, medications and labs.  General: alert, cooperative and no distress GI: soft, non-tender; bowel sounds normal; no masses,  no organomegaly and incision: clean, dry and intact Extremities: extremities normal, atraumatic, no cyanosis or edema, Homans sign is negative, no sign of DVT, no edema, redness or tenderness in the calves or thighs and SCD's on and running Vaginal Bleeding: none  Assessment: s/p Procedure(s): CYSTOSCOPY (N/A) HYSTERECTOMY ABDOMINAL (N/A): stable and progressing well  Plan: Advance diet Encourage ambulation Advance to PO medication Discontinue IV fluids  LOS: 0 days    Cheyenne Larsen, [place] 04/01/2016, 7:49 AM

## 2016-04-01 NOTE — Progress Notes (Signed)
GYN NOTE  S:Patient just had an episode of vomiting after she was given ibuprofen.  She denies feeling nauseous throughout the day.  She has been ambulating and voiding w/o difficulty. She ate lunch w/o vomiting.  She has not passed flatus  O: AVSS ABD: Soft, appropriately tender, moderate distention hypoactive bowel sounds.   A/P: s/p TAH bilateral salpingectomy possible ileus -Will watch po intake for dinner, if vomiting occurs again will get abdominal series.  -Will keep patient overnight until tomorrow  Caffie Damme

## 2016-04-02 MED ORDER — IBUPROFEN 600 MG PO TABS: 600.0000 mg | ORAL_TABLET | Freq: Four times a day (QID) | ORAL | 1 refills | Status: DC

## 2016-04-02 MED ORDER — OXYCODONE-ACETAMINOPHEN 5-325 MG PO TABS: 1.0000 | ORAL_TABLET | ORAL | 0 refills | Status: DC | PRN

## 2016-04-02 NOTE — Progress Notes (Signed)
2 Days Post-Op Procedure(s) (LRB): CYSTOSCOPY (N/A) HYSTERECTOMY ABDOMINAL (N/A)  Subjective: Patient reports tolerating PO, + flatus and no problems voiding.   Patient feeling much better today, no nausea or vomiting overnight  Ambulating w/o difficulty  Objective: I have reviewed patient's vital signs, intake and output and medications.  General: alert, cooperative and no distress GI: soft, non-tender; bowel sounds normal; no masses,  no organomegaly and incision: clean, dry and intact Extremities: extremities normal, atraumatic, no cyanosis or edema, Homans sign is negative, no sign of DVT and no edema, redness or tenderness in the calves or thighs Vaginal Bleeding: none  Assessment: s/p Procedure(s): CYSTOSCOPY (N/A) HYSTERECTOMY ABDOMINAL (N/A): progressing well and tolerating diet  Plan: Discharge home  LOS: 1 day    [place], [place] 04/02/2016, 8:20 AM

## 2016-04-02 NOTE — Discharge Summary (Signed)
Physician Discharge Summary  Patient ID: Cheyenne Larsen MRN: [id number] DOB/AGE: 04/14/1971 45 y.o.  Admit date: 03/31/2016 Discharge date: 04/02/2016  Admission Diagnoses: Symptomatic fibroid uterus  Discharge Diagnoses:  Same as above Active Problems:   Status post abdominal hysterectomy   Discharged Condition: good  Hospital Course: Patient taken to OR where above procedure was performed.  There were no intraoperative or post operative complications. Patient had some Nausea on POD#1 and she was kept for an additional day. On POD#2 she progressed Well, no N/V, ambulating w/o difficulty and tolerating po and she was discharged home  Consults: None  Significant Diagnostic Studies: labs: post op Hb 11  Treatments: IV hydration and surgery: TAH / Bilateral salpingectomy  Discharge Exam: Blood pressure 116/71, pulse 71, temperature 98.4 F (36.9 C), temperature source Oral, resp. rate 16, height 5' (1.524 m), weight 81.2 kg (179 lb), SpO2 98 %. General: alert, cooperative and no distress GI: soft, non-tender; bowel sounds normal; no masses,  no organomegaly and incision: clean, dry and intact Extremities: extremities normal, atraumatic, no cyanosis or edema, Homans sign is negative, no sign of DVT and no edema, redness or tenderness in the calves or thighs Vaginal Bleeding: none  Disposition:   Discharge Instructions     Remove dressing in 72 hours    Complete by:  As directed   Call MD for:    Complete by:  As directed   Abnormal foul smelling vaginal discharge or heavy vaginal bleeding   Call MD for:  difficulty breathing, headache or visual disturbances    Complete by:  As directed   Call MD for:  extreme fatigue    Complete by:  As directed   Call MD for:  hives    Complete by:  As directed   Call MD for:  persistant dizziness or light-headedness    Complete by:  As directed   Call MD for:  persistant nausea and vomiting    Complete by:  As directed   Call MD for:   redness, tenderness, or signs of infection (pain, swelling, redness, odor or green/yellow discharge around incision site)    Complete by:  As directed   Call MD for:  severe uncontrolled pain    Complete by:  As directed   Call MD for:  temperature >100.4    Complete by:  As directed   Diet - low sodium heart healthy    Complete by:  As directed   Discharge instructions    Complete by:  As directed   Call for your post operative visit, and if you have any questions or concerns.   Driving Restrictions    Complete by:  As directed   No driving for at least 10 days or while taking percocet   Increase activity slowly    Complete by:  As directed   Lifting restrictions    Complete by:  As directed   Don't lift anything heavier than 15 to 20 pounds   Sexual Activity Restrictions    Complete by:  As directed   No intercourse for 4 weeks       Medication List    STOP taking these medications   meloxicam 15 MG tablet Commonly known as:  MOBIC     TAKE these medications   furosemide 20 MG tablet Commonly known as:  LASIX 1 tab as needed for edema What changed:  how much to take  how to take this  when to take this  reasons to take  this  additional instructions   ibuprofen 600 MG tablet Commonly known as:  ADVIL,MOTRIN Take 1 tablet (600 mg total) by mouth every 6 (six) hours. What changed:  medication strength  how much to take  when to take this  reasons to take this   omeprazole 40 MG capsule Commonly known as:  PRILOSEC Take 1 capsule (40 mg total) by mouth daily.   oxyCODONE-acetaminophen 5-325 MG tablet Commonly known as:  PERCOCET/ROXICET Take 1-2 tablets by mouth every 4 (four) hours as needed (moderate to severe pain (when tolerating fluids)).   pseudoephedrine 30 MG tablet Commonly known as:  SUDAFED Take 30 mg by mouth every 4 (four) hours as needed for congestion.   TRAVATAN Z 0.004 % Soln ophthalmic solution Generic drug:  Travoprost (BAK  Free) Place 1 drop into both eyes at bedtime.        SignedCaffie Damme 04/02/2016, 8:24 AM

## 2016-04-02 NOTE — Progress Notes (Signed)
Pt discharged to home with husband.  Condition stable.  Pt home wearing abdominal binder.  Pt ambulated to car with A. Lysbeth Galas, [place].  No other equipment for home ordered at discharge.

## 2016-04-10 ENCOUNTER — Other Ambulatory Visit: Payer: Self-pay | Admitting: Obstetrics & Gynecology

## 2016-05-24 LAB — HM MAMMOGRAPHY: HM Mammogram: NORMAL (ref 0–4)

## 2017-01-06 ENCOUNTER — Encounter: Payer: Self-pay | Admitting: Gynecology

## 2017-01-20 ENCOUNTER — Encounter: Payer: Self-pay | Admitting: Family Medicine

## 2017-01-20 ENCOUNTER — Ambulatory Visit (INDEPENDENT_AMBULATORY_CARE_PROVIDER_SITE_OTHER): Payer: Managed Care, Other (non HMO) | Admitting: Family Medicine

## 2017-01-20 VITALS — BP 120/83 | HR 101 | Temp 98.7°F | Resp 17 | Ht 60.0 in | Wt 189.5 lb

## 2017-01-20 DIAGNOSIS — J320 Chronic maxillary sinusitis: Secondary | ICD-10-CM

## 2017-01-20 MED ORDER — AMOXICILLIN 875 MG PO TABS
875.0000 mg | ORAL_TABLET | Freq: Two times a day (BID) | ORAL | 0 refills | Status: DC
Start: 2017-01-20 — End: 2017-04-19

## 2017-01-20 NOTE — Progress Notes (Signed)
   Subjective:    Patient ID: Cheyenne Larsen, female    DOB: March 02, 1971, 46 y.o.   MRN: [id number]  HPI Itchy/watery eyes- sxs started in March w/ seasonal allergies.  Pt went to eye doctor and was told she had severe dry eye- given drops that contained steroids and abx.  sxs resolved after a few days of use but then returned.  Eyes are now bright red bilaterally, itchy and burning.  Tearing frequently.  Was taking OTC Zyrtec, switched to Claritin w/o relief.  Now using Flonase daily- started 3 days ago.  Having R facial pain/pressure.  Used OTC [place]   Review of Systems For ROS see HPI     Objective:   Physical Exam  Constitutional: She appears well-developed and well-nourished. No distress.  HENT:  Head: Normocephalic and atraumatic.  Right Ear: Tympanic membrane normal.  Left Ear: Tympanic membrane normal.  Nose: Mucosal edema and rhinorrhea present. Right sinus exhibits maxillary sinus tenderness. Right sinus exhibits no frontal sinus tenderness. Left sinus exhibits maxillary sinus tenderness. Left sinus exhibits no frontal sinus tenderness.  Mouth/Throat: Uvula is midline and mucous membranes are normal. Posterior oropharyngeal erythema present. No oropharyngeal exudate.  Eyes: Conjunctivae and EOM are normal. Pupils are equal, round, and reactive to light.  Neck: Normal range of motion. Neck supple.  Cardiovascular: Normal rate, regular rhythm and normal heart sounds.   Pulmonary/Chest: Effort normal and breath sounds normal. No respiratory distress. She has no wheezes.  Lymphadenopathy:    She has no cervical adenopathy.  Vitals reviewed.         Assessment & Plan:  Maxillary sinusitis- new.  Pt's sxs and PE consistent w/ infxn.  Suspect her eye issues are due to inflammed sinuses.  Start abx.  Continue daily allergy medication.  Reviewed supportive care and red flags that should prompt return.  Pt expressed understanding and is in agreement w/ plan.

## 2017-01-20 NOTE — Patient Instructions (Signed)
Schedule your complete physical at your convenience Start the Amoxicillin twice daily- take w/ food- for the sinus infection Drink plenty of fluids Continue the Zyrtec and Flonase daily If no improvement in your eye symptoms- please go back to the eye doctor Call with any questions or concerns Hang in there!!!

## 2017-01-20 NOTE — Progress Notes (Signed)
Pre visit review using our clinic review tool, if applicable. No additional management support is needed unless otherwise documented below in the visit note. 

## 2017-04-19 ENCOUNTER — Encounter: Payer: Self-pay | Admitting: Family Medicine

## 2017-04-19 ENCOUNTER — Ambulatory Visit (INDEPENDENT_AMBULATORY_CARE_PROVIDER_SITE_OTHER): Payer: Managed Care, Other (non HMO) | Admitting: Family Medicine

## 2017-04-19 VITALS — BP 122/81 | HR 94 | Temp 98.6°F | Resp 16 | Ht 60.0 in | Wt 190.1 lb

## 2017-04-19 DIAGNOSIS — E669 Obesity, unspecified: Secondary | ICD-10-CM

## 2017-04-19 DIAGNOSIS — Z0000 Encounter for general adult medical examination without abnormal findings: Secondary | ICD-10-CM | POA: Diagnosis not present

## 2017-04-19 LAB — BASIC METABOLIC PANEL
BUN: 7 mg/dL (ref 6–23)
CHLORIDE: 108 meq/L (ref 96–112)
CO2: 26 meq/L (ref 19–32)
Calcium: 8.9 mg/dL (ref 8.4–10.5)
Creatinine, Ser: 0.83 mg/dL (ref 0.40–1.20)
GFR: 94.52 mL/min (ref >60.00)
GLUCOSE: 125 mg/dL — AB (ref 70–99)
POTASSIUM: 4.1 meq/L (ref 3.5–5.1)
SODIUM: 140 meq/L (ref 135–145)

## 2017-04-19 LAB — CBC WITH DIFFERENTIAL/PLATELET
BASOS ABS: 0.2 10*3/uL — AB (ref 0.0–0.1)
BASOS PCT: 2 % (ref 0.0–3.0)
EOS ABS: 0.2 10*3/uL (ref 0.0–0.7)
Eosinophils Relative: 2.8 % (ref 0.0–5.0)
HEMATOCRIT: 40.4 % (ref 36.0–46.0)
Hemoglobin: 13 g/dL (ref 12.0–15.0)
LYMPHS PCT: 36.3 % (ref 12.0–46.0)
Lymphs Abs: 2.8 10*3/uL (ref 0.7–4.0)
MCHC: 32.3 g/dL (ref 30.0–36.0)
MCV: 84.5 fl (ref 78.0–100.0)
Monocytes Absolute: 0.5 10*3/uL (ref 0.1–1.0)
Monocytes Relative: 6.6 % (ref 3.0–12.0)
NEUTROS ABS: 4.1 10*3/uL (ref 1.4–7.7)
NEUTROS PCT: 52.3 % (ref 43.0–77.0)
Platelets: 335 10*3/uL (ref 150.0–400.0)
RBC: 4.78 Mil/uL (ref 3.87–5.11)
RDW: 14.3 % (ref 11.5–15.5)
WBC: 7.8 10*3/uL (ref 4.0–10.5)

## 2017-04-19 LAB — HEPATIC FUNCTION PANEL
ALT: 17 U/L (ref 0–35)
AST: 13 U/L (ref 0–37)
Albumin: 4.1 g/dL (ref 3.5–5.2)
Alkaline Phosphatase: 70 U/L (ref 39–117)
BILIRUBIN DIRECT: 0.1 mg/dL (ref 0.0–0.3)
BILIRUBIN TOTAL: 0.2 mg/dL (ref 0.2–1.2)
TOTAL PROTEIN: 6.6 g/dL (ref 6.0–8.3)

## 2017-04-19 LAB — LIPID PANEL
Cholesterol: 170 mg/dL (ref 0–200)
HDL: 35 mg/dL — ABNORMAL LOW (ref >39.00)
LDL Cholesterol: 103 mg/dL — ABNORMAL HIGH (ref 0–99)
NONHDL: 134.55
Total CHOL/HDL Ratio: 5
Triglycerides: 156 mg/dL — ABNORMAL HIGH (ref 0.0–149.0)
VLDL: 31.2 mg/dL (ref 0.0–40.0)

## 2017-04-19 LAB — TSH: TSH: 0.99 u[IU]/mL (ref 0.35–4.50)

## 2017-04-19 NOTE — Progress Notes (Signed)
   Subjective:    Patient ID: Cheyenne Larsen, female    DOB: [birth year], 46 y.o.   MRN: [id number]  HPI CPE- UTD on mammo, no need for pap due to hysterectomy.  UTD on Tdap.  Pt declines flu shot due to previous bad reaction.   Review of Systems Patient reports no vision/ hearing changes, adenopathy,fever, weight change,  persistant/recurrent hoarseness , swallowing issues, chest pain, palpitations, edema, persistant/recurrent cough, hemoptysis, dyspnea (rest/exertional/paroxysmal nocturnal), gastrointestinal bleeding (melena, rectal bleeding), abdominal pain, significant heartburn, bowel changes, GU symptoms (dysuria, hematuria, incontinence), Gyn symptoms (abnormal  bleeding, pain),  syncope, focal weakness, memory loss, numbness & tingling, skin/hair/nail changes, abnormal bruising or bleeding, anxiety, or depression.     Objective:   Physical Exam General Appearance:    Alert, cooperative, no distress, appears stated age  Head:    Normocephalic, without obvious abnormality, atraumatic  Eyes:    PERRL, conjunctiva/corneas clear, EOM's intact, fundi    benign, both eyes  Ears:    Normal TM's and external ear canals, both ears  Nose:   Nares normal, septum midline, mucosa normal, no drainage    or sinus tenderness  Throat:   Lips, mucosa, and tongue normal; teeth and gums normal  Neck:   Supple, symmetrical, trachea midline, no adenopathy;    Thyroid: no enlargement/tenderness/nodules  Back:     Symmetric, no curvature, ROM normal, no CVA tenderness  Lungs:     Clear to auscultation bilaterally, respirations unlabored  Chest Wall:    No tenderness or deformity   Heart:    Regular rate and rhythm, S1 and S2 normal, no murmur, rub   or gallop  Breast Exam:    Deferred to GYN  Abdomen:     Soft, non-tender, bowel sounds active all four quadrants,    no masses, no organomegaly  Genitalia:    Deferred to GYN  Rectal:    Extremities:   Extremities normal, atraumatic, no cyanosis or  edema  Pulses:   2+ and symmetric all extremities  Skin:   Skin color, texture, turgor normal, no rashes or lesions  Lymph nodes:   Cervical, supraclavicular, and axillary nodes normal  Neurologic:   CNII-XII intact, normal strength, sensation and reflexes    throughout          Assessment & Plan:

## 2017-04-19 NOTE — Assessment & Plan Note (Signed)
Ongoing issue for pt.  Again stressed need for healthy diet and regular exercise.  Check labs to risk stratify.  Will follow.

## 2017-04-19 NOTE — Progress Notes (Signed)
Pre visit review using our clinic review tool, if applicable. No additional management support is needed unless otherwise documented below in the visit note. 

## 2017-04-19 NOTE — Patient Instructions (Signed)
Follow up in 1 year or as needed We'll notify you of your lab results and make any changes if needed Continue to work on healthy diet and regular exercise- you can do it! You are up to date on mammogram until October- please schedule w/ Dr Alwyn Pea Continue to use heat, and topical pain relievers such as Salon Pas or Ephraim Hamburger for the shoulder pain.  Also ibuprofen as needed Call with any questions or concerns Happy Labor Day!!!

## 2017-04-19 NOTE — Assessment & Plan Note (Signed)
Pt's PE WNL w/ exception of obesity.  UTD on mammo.  No need for pap due to hysterectomy.  Declines flu.  UTD on Tdap.  Check labs.  Anticipatory guidance provided.

## 2017-04-20 ENCOUNTER — Encounter: Payer: Self-pay | Admitting: General Practice

## 2017-05-26 ENCOUNTER — Encounter: Payer: Self-pay | Admitting: Physician Assistant

## 2017-05-26 ENCOUNTER — Ambulatory Visit (INDEPENDENT_AMBULATORY_CARE_PROVIDER_SITE_OTHER): Payer: 59 | Admitting: Physician Assistant

## 2017-05-26 VITALS — BP 120/86 | HR 82 | Temp 98.8°F | Resp 14 | Ht 60.0 in | Wt 187.0 lb

## 2017-05-26 DIAGNOSIS — J208 Acute bronchitis due to other specified organisms: Secondary | ICD-10-CM | POA: Diagnosis not present

## 2017-05-26 MED ORDER — ONDANSETRON HCL 4 MG PO TABS: 4.0000 mg | ORAL_TABLET | Freq: Three times a day (TID) | ORAL | 0 refills | Status: DC | PRN

## 2017-05-26 MED ORDER — AZELASTINE-FLUTICASONE 137-50 MCG/ACT NA SUSP: 1.0000 | Freq: Two times a day (BID) | NASAL | 0 refills | Status: DC

## 2017-05-26 MED ORDER — BENZONATATE 100 MG PO CAPS: 100.0000 mg | ORAL_CAPSULE | Freq: Two times a day (BID) | ORAL | 0 refills | Status: DC | PRN

## 2017-05-26 NOTE — Patient Instructions (Signed)
Increase fluids.  Get plenty of rest. Use Mucinex for congestion. Start a daily Xyzal. Start a Zantac 150 mg at night. Continue Prilosec. Tessalon as directed for cough. The new nasal spray is to replace they Flonase. You have most likely had a viral bronchitis and now have a post-viral cough worsened by nasal drainage and reflux.. Take a daily probiotic (I recommend Align or Culturelle, but even Activia Yogurt may be beneficial).  A humidifier placed in the bedroom may offer some relief for a dry, scratchy throat of nasal irritation.  Read information below on acute bronchitis. Please call or return to clinic if symptoms are not improving.  Acute Bronchitis Bronchitis is when the airways that extend from the windpipe into the lungs get red, puffy, and painful (inflamed). Bronchitis often causes thick spit (mucus) to develop. This leads to a cough. A cough is the most common symptom of bronchitis. In acute bronchitis, the condition usually begins suddenly and goes away over time (usually in 2 weeks). Smoking, allergies, and asthma can make bronchitis worse. Repeated episodes of bronchitis may cause more lung problems.  HOME CARE  Rest.  Drink enough fluids to keep your pee (urine) clear or pale yellow (unless you need to limit fluids as told by your doctor).  Only take over-the-counter or prescription medicines as told by your doctor.  Avoid smoking and secondhand smoke. These can make bronchitis worse. If you are a smoker, think about using nicotine gum or skin patches. Quitting smoking will help your lungs heal faster.  Reduce the chance of getting bronchitis again by:  Washing your hands often.  Avoiding people with cold symptoms.  Trying not to touch your hands to your mouth, nose, or eyes.  Follow up with your doctor as told.  GET HELP IF: Your symptoms do not improve after 1 week of treatment. Symptoms include:  Cough.  Fever.  Coughing up thick spit.  Body aches.  Chest  congestion.  Chills.  Shortness of breath.  Sore throat.  GET HELP RIGHT AWAY IF:   You have an increased fever.  You have chills.  You have severe shortness of breath.  You have bloody thick spit (sputum).  You throw up (vomit) often.  You lose too much body fluid (dehydration).  You have a severe headache.  You faint.  MAKE SURE YOU:   Understand these instructions.  Will watch your condition.  Will get help right away if you are not doing well or get worse. Document Released: 01/27/2008 Document Revised: 04/12/2013 Document Reviewed: 01/31/2013 [employer] Patient Information 2015 [place], [place]. This information is not intended to replace advice given to you by your health care provider. Make sure you discuss any questions you have with your health care provider.

## 2017-05-26 NOTE — Progress Notes (Signed)
Pre visit review using our clinic review tool, if applicable. No additional management support is needed unless otherwise documented below in the visit note. 

## 2017-05-26 NOTE — Progress Notes (Signed)
Subjective:     Cheyenne Larsen is a 46 y.o. female who presents for evaluation of symptoms of a URI. Symptoms include congestion, cough described as nonproductive, nasal congestion, post nasal drip and sinus pressure. Denies fever, chills, dizziness. Is getting choked on nasal drainage and has had episodes of spitting up after coughing. Denies sore throat or odynophagia. Has history of seasonal allergies. Onset of symptoms was 3 weeks ago, and has been gradually worsening since that time. Treatment to date: cough suppressants and decongestants.  The following portions of the patient's history were reviewed and updated as appropriate: allergies, current medications, past family history, past medical history, past social history, past surgical history and problem list.  Review of Systems Pertinent items are noted in HPI.   Objective:    BP 120/86   Pulse 82   Temp 98.8 F (37.1 C) (Oral)   Resp 14   Ht 5' (1.524 m)   Wt 187 lb (84.8 kg)   LMP 12/31/2011   SpO2 98%   BMI 36.52 kg/m  General appearance: alert, cooperative, appears stated age and no distress Head: Normocephalic, without obvious abnormality, atraumatic Ears: abnormal TM right ear - serous middle ear fluid and abnormal TM left ear - serous middle ear fluid Nose: Nares normal. Septum midline. Mucosa normal. No drainage or sinus tenderness. Throat: lips, mucosa, and tongue normal; teeth and gums normal Lungs: clear to auscultation bilaterally Heart: regular rate and rhythm, S1, S2 normal, no murmur, click, rub or gallop Lymph nodes: Cervical, supraclavicular, and axillary nodes normal.   Assessment:    bronchitis   Plan:    Discussed diagnosis and treatment of URI. Discussed the importance of avoiding unnecessary antibiotic therapy. Suggested symptomatic OTC remedies. Nasal saline spray for congestion. Nasal steroids per orders. Follow up as needed.

## 2017-06-01 ENCOUNTER — Telehealth: Payer: Self-pay | Admitting: Family Medicine

## 2017-06-01 MED ORDER — AZITHROMYCIN 250 MG PO TABS: ORAL_TABLET | ORAL | 0 refills | Status: DC

## 2017-06-01 NOTE — Telephone Encounter (Signed)
Patient aware that medication has been called in. She is aware that if there is no improvement she will need to follow-up in the office.

## 2017-06-01 NOTE — Telephone Encounter (Signed)
Continue cough medication. An antibiotic has been sent in.  If not improving, will need follow-up.

## 2017-06-01 NOTE — Telephone Encounter (Signed)
Pt states that she is not feeling any better from her visit with [facility] on 10/3 and asking if he would call something in for her. [facility] on [address]

## 2017-06-23 LAB — HM PAP SMEAR

## 2017-06-24 ENCOUNTER — Other Ambulatory Visit: Payer: Self-pay | Admitting: Obstetrics & Gynecology

## 2017-06-24 DIAGNOSIS — R928 Other abnormal and inconclusive findings on diagnostic imaging of breast: Secondary | ICD-10-CM

## 2017-07-01 ENCOUNTER — Ambulatory Visit
Admission: RE | Admit: 2017-07-01 | Discharge: 2017-07-01 | Disposition: A | Discharge status: Home / Self Care | Payer: 59 | Source: Ambulatory Visit | Attending: Obstetrics & Gynecology | Admitting: Obstetrics & Gynecology

## 2017-07-01 ENCOUNTER — Other Ambulatory Visit: Payer: 59

## 2017-07-01 ENCOUNTER — Other Ambulatory Visit: Payer: Self-pay | Admitting: Obstetrics & Gynecology

## 2017-07-01 DIAGNOSIS — N641 Fat necrosis of breast: Secondary | ICD-10-CM

## 2017-07-01 DIAGNOSIS — R928 Other abnormal and inconclusive findings on diagnostic imaging of breast: Secondary | ICD-10-CM

## 2017-07-08 LAB — HM MAMMOGRAPHY

## 2017-07-13 ENCOUNTER — Encounter: Payer: Self-pay | Admitting: General Practice

## 2017-08-05 ENCOUNTER — Other Ambulatory Visit: Payer: 59

## 2017-08-09 ENCOUNTER — Ambulatory Visit
Admission: RE | Admit: 2017-08-09 | Discharge: 2017-08-09 | Disposition: A | Discharge status: Home / Self Care | Payer: 59 | Source: Ambulatory Visit | Attending: Obstetrics & Gynecology | Admitting: Obstetrics & Gynecology

## 2017-08-09 ENCOUNTER — Other Ambulatory Visit: Payer: Self-pay | Admitting: Obstetrics & Gynecology

## 2017-08-09 DIAGNOSIS — N641 Fat necrosis of breast: Secondary | ICD-10-CM

## 2017-11-08 ENCOUNTER — Ambulatory Visit: Payer: 59

## 2017-11-08 ENCOUNTER — Ambulatory Visit
Admission: RE | Admit: 2017-11-08 | Discharge: 2017-11-08 | Disposition: A | Discharge status: Home / Self Care | Payer: 59 | Source: Ambulatory Visit | Attending: Obstetrics & Gynecology | Admitting: Obstetrics & Gynecology

## 2017-11-08 DIAGNOSIS — N641 Fat necrosis of breast: Secondary | ICD-10-CM

## 2017-12-13 ENCOUNTER — Ambulatory Visit: Payer: Self-pay | Admitting: *Deleted

## 2017-12-13 NOTE — Telephone Encounter (Signed)
  Called in c/o seeing one drop of blood on tissue 2 weeks ago.   She c/o hemorrhoid pain and itching for 2-3 weeks.   Her BMs are different than usual.   Intermittent with constipation and diarrhea along with lower back pain for the last 2-3 weeks. Been using Prep H and hydrocortisone cream with minimal relief.   Also sitting  in warm [place] baths has helped some too.  I made her an appt with Dr. Jonni Sanger since her PCP Dr. Birdie Riddle is out of the office this week.  Appt is 12/14/17 at 8:30am. Reason for Disposition . [1] Rectal bleeding is minimal (e.g., blood just on toilet paper, few drops, streaks on surface of normal formed BM) AND [2] bleeding recurs 3 or more times on treatment  Answer Assessment - Initial Assessment Questions 1. APPEARANCE of BLOOD: "What color is it?" "Is it passed separately, on the surface of the stool, or mixed in with the stool?"      Bright red blood 2 weeks ago.   I have hemorrhoids for 2-3 weeks that have been itching and hurting.   Using Prep H.  I think I scratched myself wiping.   I've also been having lower back pain. 2. AMOUNT: "How much blood was passed?"      I saw it 2 weeks ago.  I was driving home and it hit me.   I had been holding the BM for 15 minutes.  When I went to bathroom I saw a drop of blood in the  Toilet.   I think it was my hemorrhoid.   3. FREQUENCY: "How many times has blood been passed with the stools?"      I've noticed a big change in my BM's.   Sometimes it is hard for a day or two and I can't go.  Then I will have diarrhea. 4. ONSET: "When was the blood first seen in the stools?" (Days or weeks)      2 weeks ago just that 1 one. 5. DIARRHEA: "Is there also some diarrhea?" If so, ask: "How many diarrhea stools were passed in past 24 hours?"      See above.   Having constipation intermittent with diarrhea. 6. CONSTIPATION: "Do you have constipation?" If so, "How bad is it?"     See above 7. RECURRENT SYMPTOMS: "Have you had blood in your  stools before?" If so, ask: "When was the last time?" and "What happened that time?"      I'm having lower back pain also.   On and off for 2 weeks.   No problem in the past with blood in stools or bleeding. 8. BLOOD THINNERS: "Do you take any blood thinners?" (e.G., Coumadin/warfarin, Pravda/dabbing, aspirin)     No 9. OTHER SYMPTOMS: "Do you have any other symptoms?"  (e.G., abdominal pain, vomiting, dizziness, fever)     Little nausea no vomiting. 10. PREGNANCY: "Is there any chance you are pregnant?" "When was your last menstrual period?"       Had partial hysterectomy  Protocols used: RECTAL BLEEDING-A-AH

## 2017-12-14 ENCOUNTER — Ambulatory Visit (INDEPENDENT_AMBULATORY_CARE_PROVIDER_SITE_OTHER): Payer: Managed Care, Other (non HMO) | Admitting: Family Medicine

## 2017-12-14 ENCOUNTER — Other Ambulatory Visit: Payer: Self-pay

## 2017-12-14 ENCOUNTER — Encounter: Payer: Self-pay | Admitting: Family Medicine

## 2017-12-14 VITALS — BP 140/80 | HR 68 | Temp 98.4°F | Ht 60.0 in | Wt 188.8 lb

## 2017-12-14 DIAGNOSIS — K5904 Chronic idiopathic constipation: Secondary | ICD-10-CM

## 2017-12-14 DIAGNOSIS — K649 Unspecified hemorrhoids: Secondary | ICD-10-CM | POA: Diagnosis not present

## 2017-12-14 NOTE — Patient Instructions (Addendum)
Please return in 2-4 weeks with Dr. Birdie Riddle to follow up on constipation and hemorrhoids.   Start taking colace twice a day until the stool is soft and take miralax daily.   Use the preparation H and steroid cream to hemorrhoids for one week.    Hemorrhoids Hemorrhoids are swollen veins in and around the rectum or anus. There are two types of hemorrhoids:  Internal hemorrhoids. These occur in the veins that are just inside the rectum. They may poke through to the outside and become irritated and painful.  External hemorrhoids. These occur in the veins that are outside of the anus and can be felt as a painful swelling or hard lump near the anus.  Most hemorrhoids do not cause serious problems, and they can be managed with home treatments such as diet and lifestyle changes. If home treatments do not help your symptoms, procedures can be done to shrink or remove the hemorrhoids. What are the causes? This condition is caused by increased pressure in the anal area. This pressure may result from various things, including:  Constipation.  Straining to have a bowel movement.  Diarrhea.  Pregnancy.  Obesity.  Sitting for long periods of time.  Heavy lifting or other activity that causes you to strain.  Anal sex.  What are the signs or symptoms? Symptoms of this condition include:  Pain.  Anal itching or irritation.  Rectal bleeding.  Leakage of stool (feces).  Anal swelling.  One or more lumps around the anus.  How is this diagnosed? This condition can often be diagnosed through a visual exam. Other exams or tests may also be done, such as:  Examination of the rectal area with a gloved hand (digital rectal exam).  Examination of the anal canal using a small tube (anoscope).  A blood test, if you have lost a significant amount of blood.  A test to look inside the colon (sigmoidoscopy or colonoscopy).  How is this treated? This condition can usually be treated at  home. However, various procedures may be done if dietary changes, lifestyle changes, and other home treatments do not help your symptoms. These procedures can help make the hemorrhoids smaller or remove them completely. Some of these procedures involve surgery, and others do not. Common procedures include:  Rubber band ligation. Rubber bands are placed at the base of the hemorrhoids to cut off the blood supply to them.  Sclerotherapy. Medicine is injected into the hemorrhoids to shrink them.  Infrared coagulation. A type of light energy is used to get rid of the hemorrhoids.  Hemorrhoidectomy surgery. The hemorrhoids are surgically removed, and the veins that supply them are tied off.  Stapled hemorrhoidopexy surgery. A circular stapling device is used to remove the hemorrhoids and use staples to cut off the blood supply to them.  Follow these instructions at home: Eating and drinking  Eat foods that have a lot of fiber in them, such as whole grains, beans, nuts, fruits, and vegetables. Ask your health care provider about taking products that have added fiber (fiber supplements).  Drink enough fluid to keep your urine clear or pale yellow. Managing pain and swelling  Take warm sitz baths for 20 minutes, 3-4 times a day to ease pain and discomfort.  If directed, apply ice to the affected area. Using ice packs between sitz baths may be helpful. ? Put ice in a plastic bag. ? Place a towel between your skin and the bag. ? Leave the ice on for 20 minutes,  2-3 times a day. General instructions  Take over-the-counter and prescription medicines only as told by your health care provider.  Use medicated creams or suppositories as told.  Exercise regularly.  Go to the bathroom when you have the urge to have a bowel movement. Do not wait.  Avoid straining to have bowel movements.  Keep the anal area dry and clean. Use wet toilet paper or moist towelettes after a bowel movement.  Do not  sit on the toilet for long periods of time. This increases blood pooling and pain. Contact a health care provider if:  You have increasing pain and swelling that are not controlled by treatment or medicine.  You have uncontrolled bleeding.  You have difficulty having a bowel movement, or you are unable to have a bowel movement.  You have pain or inflammation outside the area of the hemorrhoids. This information is not intended to replace advice given to you by your health care provider. Make sure you discuss any questions you have with your health care provider. Document Released: 08/07/2000 Document Revised: 01/08/2016 Document Reviewed: 04/24/2015 Elsevier Interactive Patient Education  [employer].  If you have any questions or concerns, please don't hesitate to send me a message via MyChart or call the office at [phone]. Thank you for visiting with [place] today! It's our pleasure caring for you.   Constipation, Adult Constipation is when a person has fewer bowel movements in a week than normal, has difficulty having a bowel movement, or has stools that are dry, hard, or larger than normal. Constipation may be caused by an underlying condition. It may become worse with age if a person takes certain medicines and does not take in enough fluids. Follow these instructions at home: Eating and drinking   Eat foods that have a lot of fiber, such as fresh fruits and vegetables, whole grains, and beans.  Limit foods that are high in fat, low in fiber, or overly processed, such as french fries, hamburgers, cookies, candies, and soda.  Drink enough fluid to keep your urine clear or pale yellow. General instructions  Exercise regularly or as told by your health care provider.  Go to the restroom when you have the urge to go. Do not hold it in.  Take over-the-counter and prescription medicines only as told by your health care provider. These include any fiber supplements.  Practice  pelvic floor retraining exercises, such as deep breathing while relaxing the lower abdomen and pelvic floor relaxation during bowel movements.  Watch your condition for any changes.  Keep all follow-up visits as told by your health care provider. This is important. Contact a health care provider if:  You have pain that gets worse.  You have a fever.  You do not have a bowel movement after 4 days.  You vomit.  You are not hungry.  You lose weight.  You are bleeding from the anus.  You have thin, pencil-like stools. Get help right away if:  You have a fever and your symptoms suddenly get worse.  You leak stool or have blood in your stool.  Your abdomen is bloated.  You have severe pain in your abdomen.  You feel dizzy or you faint. This information is not intended to replace advice given to you by your health care provider. Make sure you discuss any questions you have with your health care provider. Document Released: 05/08/2004 Document Revised: 02/28/2016 Document Reviewed: 01/29/2016 Elsevier Interactive Patient Education  2018 [employer].

## 2017-12-14 NOTE — Progress Notes (Signed)
Subjective  CC:  Chief Complaint  Patient presents with  . Rectal Bleeding    Patient states she has had hemorrhoids for about 3 weeks, bowels have been inconsitant  some days she is constipated , then she will have diarrhea.     HPI: Cheyenne Larsen is a 47 y.o. female who presents to the office today to address the problems listed above in the chief complaint.  New pt to me: acute care visit for RLQ pain, hemorrhoids, constipation and right back pain. Reports 3-4 days of aching in right lower abdomen. Has been constipated with hard firm small stools with straining. Has active rectal hemorrhoids with some bright red blood on TP and in bowl. Stool is brown. No F/c/s or vomiting. Has had problems with constipation on and off for years. No melena. Appetite is fine. Has been eating more cheese and dairy.    I reviewed the patients updated PMH, FH, and SocHx.    Patient Active Problem List   Diagnosis Date Noted  . Status post abdominal hysterectomy 03/31/2016  . Esophageal dysphagia 02/19/2016  . Subserous leiomyoma of uterus 05/31/2015  . Hematometra 05/31/2015  . Female pelvic pain 05/31/2015  . S/P endometrial ablation 04/26/2015  . Obesity (BMI 30-39.9) 11/21/2014  . UTI (urinary tract infection) 04/20/2014  . Edema 12/01/2013  . Scalp irritation 07/27/2013  . Hemorrhoids 01/08/2012  . Family history of blood clots 01/08/2012  . Screening for malignant neoplasm of the cervix 01/08/2012  . Routine general medical examination at a health care facility 01/08/2012  . GERD (gastroesophageal reflux disease) 01/08/2012  . NEOPLASM OF UNCERTAIN BEHAVIOR OF SKIN 10/17/2010  . THYROMEGALY 10/17/2010  . CERVICAL POLYP 10/17/2010  . BLOOD IN STOOL 04/18/2010  . BACK PAIN 04/18/2010   Current Meds  Medication Sig  . Azelastine-Fluticasone 137-50 MCG/ACT SUSP Place 1 spray into the nose 2 (two) times daily.  . Multiple Vitamin (MULTIVITAMIN) tablet Take 1 tablet by mouth  daily.  Marland Kitchen omeprazole (PRILOSEC) 40 MG capsule Take 1 capsule (40 mg total) by mouth daily.    Allergies: Patient is allergic to influenza vaccines. Family History: Patient family history includes Alzheimer's disease in her paternal grandfather; Cancer in her maternal grandfather; Diabetes in her father; Glaucoma in her father; Heart attack in her maternal grandmother; Heart disease in her sister; Hypertension in her father and mother; Ovarian cancer in her paternal grandmother. Social History:  Patient  reports that she has never smoked. She has never used smokeless tobacco. She reports that she does not drink alcohol or use drugs.  Review of Systems: Constitutional: Negative for fever malaise or anorexia Cardiovascular: negative for chest pain Respiratory: negative for SOB or persistent cough Gastrointestinal: negative for abdominal pain  Objective  Vitals: BP 140/80   Pulse 68   Temp 98.4 F (36.9 C)   Ht 5' (1.524 m)   Wt 188 lb 12.8 oz (85.6 kg)   LMP 12/31/2011   BMI 36.87 kg/m  General: no acute distress , A&Ox3 HEENT: PEERL, conjunctiva normal, Oropharynx moist,neck is supple Cardiovascular:  RRR without murmur or gallop.  Respiratory:  Good breath sounds bilaterally, CTAB with normal respiratory effort Gastrointestinal: soft, flat abdomen, normal active bowel sounds, no palpable masses, no hepatosplenomegaly, no appreciated hernias, minimal ttp RLQ w/o rebound or guarding.  Skin:  Warm, no rashes MSK: right upper paravertebral muscles with ttp, no rash. Neg SLR bilaterally  Assessment  1. Chronic idiopathic constipation   2. Bleeding hemorrhoid  Plan   Discussed mgt of constipation and hemorrhoids; suspect mild pain is due to constipation but discussed worsening and red flag sxs. Start high fiber diet, stool softener and miralax. Recommend hemorrhoid cream. Reassured. Increase fluids/water. F/u for recheck; if worsens, return for further eval.   Follow up:  Return in about 1 month (around 01/11/2018) for follow up on constipation and hemorrhoids with Dr. Birdie Riddle.    Commons side effects, risks, benefits, and alternatives for medications and treatment plan prescribed today were discussed, and the patient expressed understanding of the given instructions. Patient is instructed to call or message via MyChart if he/she has any questions or concerns regarding our treatment plan. No barriers to understanding were identified. We discussed Red Flag symptoms and signs in detail. Patient expressed understanding regarding what to do in case of urgent or emergency type symptoms.   Medication list was reconciled, printed and provided to the patient in AVS. Patient instructions and summary information was reviewed with the patient as documented in the AVS. This note was prepared with assistance of Dragon voice recognition software. Occasional wrong-word or sound-a-like substitutions may have occurred due to the inherent limitations of voice recognition software  No orders of the defined types were placed in this encounter.  No orders of the defined types were placed in this encounter.

## 2018-02-18 ENCOUNTER — Other Ambulatory Visit: Payer: Self-pay | Admitting: General Practice

## 2018-02-18 ENCOUNTER — Encounter: Payer: Self-pay | Admitting: Family Medicine

## 2018-02-18 ENCOUNTER — Other Ambulatory Visit: Payer: Self-pay

## 2018-02-18 ENCOUNTER — Ambulatory Visit (INDEPENDENT_AMBULATORY_CARE_PROVIDER_SITE_OTHER): Payer: Managed Care, Other (non HMO) | Admitting: Family Medicine

## 2018-02-18 VITALS — BP 130/80 | HR 79 | Temp 98.0°F | Resp 16 | Ht 60.0 in | Wt 191.1 lb

## 2018-02-18 DIAGNOSIS — K649 Unspecified hemorrhoids: Secondary | ICD-10-CM

## 2018-02-18 DIAGNOSIS — E669 Obesity, unspecified: Secondary | ICD-10-CM

## 2018-02-18 DIAGNOSIS — R200 Anesthesia of skin: Secondary | ICD-10-CM | POA: Diagnosis not present

## 2018-02-18 DIAGNOSIS — R202 Paresthesia of skin: Secondary | ICD-10-CM

## 2018-02-18 LAB — BASIC METABOLIC PANEL
BUN: 12 mg/dL (ref 6–23)
CHLORIDE: 107 meq/L (ref 96–112)
CO2: 24 meq/L (ref 19–32)
CREATININE: 0.8 mg/dL (ref 0.40–1.20)
Calcium: 9 mg/dL (ref 8.4–10.5)
GFR: 98.28 mL/min (ref >60.00)
Glucose, Bld: 109 mg/dL — ABNORMAL HIGH (ref 70–99)
POTASSIUM: 4.1 meq/L (ref 3.5–5.1)
SODIUM: 140 meq/L (ref 135–145)

## 2018-02-18 LAB — CBC WITH DIFFERENTIAL/PLATELET
BASOS PCT: 0.7 % (ref 0.0–3.0)
Basophils Absolute: 0 10*3/uL (ref 0.0–0.1)
EOS PCT: 5.5 % — AB (ref 0.0–5.0)
Eosinophils Absolute: 0.4 10*3/uL (ref 0.0–0.7)
HCT: 38.8 % (ref 36.0–46.0)
HEMOGLOBIN: 12.9 g/dL (ref 12.0–15.0)
Lymphocytes Relative: 32.1 % (ref 12.0–46.0)
Lymphs Abs: 2.3 10*3/uL (ref 0.7–4.0)
MCHC: 33.1 g/dL (ref 30.0–36.0)
MCV: 82.7 fl (ref 78.0–100.0)
Monocytes Absolute: 0.4 10*3/uL (ref 0.1–1.0)
Monocytes Relative: 5.1 % (ref 3.0–12.0)
NEUTROS ABS: 4 10*3/uL (ref 1.4–7.7)
NEUTROS PCT: 56.6 % (ref 43.0–77.0)
PLATELETS: 348 10*3/uL (ref 150.0–400.0)
RBC: 4.69 Mil/uL (ref 3.87–5.11)
RDW: 14.6 % (ref 11.5–15.5)
WBC: 7.1 10*3/uL (ref 4.0–10.5)

## 2018-02-18 LAB — HEPATIC FUNCTION PANEL
ALK PHOS: 63 U/L (ref 39–117)
ALT: 16 U/L (ref 0–35)
AST: 13 U/L (ref 0–37)
Albumin: 4.1 g/dL (ref 3.5–5.2)
BILIRUBIN DIRECT: 0.1 mg/dL (ref 0.0–0.3)
BILIRUBIN TOTAL: 0.3 mg/dL (ref 0.2–1.2)
Total Protein: 6.5 g/dL (ref 6.0–8.3)

## 2018-02-18 LAB — LIPID PANEL
CHOL/HDL RATIO: 4
Cholesterol: 169 mg/dL (ref 0–200)
HDL: 45.7 mg/dL (ref >39.00)
LDL Cholesterol: 112 mg/dL — ABNORMAL HIGH (ref 0–99)
NonHDL: 123.09
Triglycerides: 55 mg/dL (ref 0.0–149.0)
VLDL: 11 mg/dL (ref 0.0–40.0)

## 2018-02-18 LAB — TSH: TSH: 1.21 u[IU]/mL (ref 0.35–4.50)

## 2018-02-18 LAB — B12 AND FOLATE PANEL
FOLATE: 13.9 ng/mL (ref >5.9)
VITAMIN B 12: 315 pg/mL (ref 211–911)

## 2018-02-18 LAB — VITAMIN D 25 HYDROXY (VIT D DEFICIENCY, FRACTURES): VITD: 15.02 ng/mL — ABNORMAL LOW (ref 30.00–100.00)

## 2018-02-18 MED ORDER — HYDROCORTISONE ACETATE 25 MG RE SUPP: 25.0000 mg | Freq: Two times a day (BID) | RECTAL | 0 refills | Status: DC

## 2018-02-18 MED ORDER — VITAMIN D (ERGOCALCIFEROL) 1.25 MG (50000 UNIT) PO CAPS: 50000.0000 [IU] | ORAL_CAPSULE | ORAL | 0 refills | Status: DC

## 2018-02-18 MED ORDER — DIBUCAINE 1 % RE OINT: 1.0000 "application " | TOPICAL_OINTMENT | RECTAL | 1 refills | Status: DC | PRN

## 2018-02-18 MED ORDER — HYDROCHLOROTHIAZIDE 12.5 MG PO TABS: 12.5000 mg | ORAL_TABLET | Freq: Every day | ORAL | 3 refills | Status: DC

## 2018-02-18 NOTE — Assessment & Plan Note (Signed)
Ongoing issue for pt.  Stressed need for healthy diet and regular exercise.  Check labs to risk stratify. 

## 2018-02-18 NOTE — Progress Notes (Signed)
   Subjective:    Patient ID: Cheyenne Larsen, female    DOB: 10/12/1970, 47 y.o.   MRN: [id number]  HPI Obesity- pt's weight is stable since last summer.  BMI is 37.3.  No regular exercise, not following particular diet.  Denies CP, SOB above baseline, abd pain, N/V.  Numbness- pt reports numbness/tingling of bilateral hands and feet.  Pt reports sxs will occur overnight and she will have to 'shake out' her hands in the AM.  She will also notice while holding her phone hands will get weak and she drops it.  sxs are much more noticeable in hands.  Pt sits at a computer all day.  Pt reports LEs will swell- particularly if she is on her feet or it is hot.  Hemorrhoids- pt has hx of this.  Doing Epsom salt baths and preparation H w/o relief.   Review of Systems For ROS see HPI     Objective:   Physical Exam  Constitutional: She is oriented to person, place, and time. She appears well-developed and well-nourished. No distress.  HENT:  Head: Normocephalic and atraumatic.  Eyes: Pupils are equal, round, and reactive to light. Conjunctivae and EOM are normal.  Neck: Normal range of motion. Neck supple. Thyromegaly (mild thyroid enlargement) present.  Cardiovascular: Normal rate, regular rhythm, normal heart sounds and intact distal pulses.  No murmur heard. Pulmonary/Chest: Effort normal and breath sounds normal. No respiratory distress.  Abdominal: Soft. She exhibits no distension. There is no tenderness.  Musculoskeletal: She exhibits no edema.  Lymphadenopathy:    She has no cervical adenopathy.  Neurological: She is alert and oriented to person, place, and time. She displays normal reflexes. She exhibits normal muscle tone.  + Phalen's bilaterally  Skin: Skin is warm and dry.  Psychiatric: She has a normal mood and affect. Her behavior is normal.  Vitals reviewed.         Assessment & Plan:  Numbness/tingling- new.  Pt has bilateral carpal tunnel sxs and + phalen's on  PE.  Encouraged OTC wrist splints to wear nightly, ice, and NSAIDs prn.  Also check B12/folate.  If no improvement, pt is to let me know so I can refer.  Pt expressed understanding and is in agreement w/ plan.

## 2018-02-18 NOTE — Patient Instructions (Signed)
Schedule your complete physical in 4-6 months We'll notify you of your lab results and make any changes if needed Continue to work on healthy diet and regular exercise- you can do it! START the Hydrochlorothiazide daily to decrease swelling INCREASE your water intake Get OTC wrist splints and wear them nightly to improve carpal tunnel ICE your wrists for pain relief USE the Hydrocortisone suppositories for hemorrhoid relief and the Nupercainal to help w/ itching/burning/pain Call with any questions or concerns Have a great summer!!

## 2018-02-18 NOTE — Assessment & Plan Note (Signed)
Ongoing issue for pt.  No relief w/ Epsom salt baths, Preparation H.  Start Hydrocortisone suppositories to improve hemorrhoids and Nupercainal for relief of itching/burning/pain.  Reviewed supportive care and red flags that should prompt return.  Pt expressed understanding and is in agreement w/ plan.

## 2018-05-09 ENCOUNTER — Other Ambulatory Visit: Payer: Self-pay | Admitting: Family Medicine

## 2018-06-30 LAB — HM PAP SMEAR

## 2018-06-30 LAB — HM MAMMOGRAPHY: HM MAMMO: NORMAL (ref 0–4)

## 2018-07-02 ENCOUNTER — Other Ambulatory Visit: Payer: Self-pay | Admitting: Family Medicine

## 2018-07-19 ENCOUNTER — Encounter: Payer: Self-pay | Admitting: Family Medicine

## 2018-07-19 ENCOUNTER — Other Ambulatory Visit: Payer: Self-pay

## 2018-07-19 ENCOUNTER — Ambulatory Visit (INDEPENDENT_AMBULATORY_CARE_PROVIDER_SITE_OTHER): Payer: Managed Care, Other (non HMO) | Admitting: Family Medicine

## 2018-07-19 VITALS — BP 121/82 | HR 90 | Temp 98.6°F | Resp 16 | Ht 60.0 in | Wt 187.5 lb

## 2018-07-19 DIAGNOSIS — E669 Obesity, unspecified: Secondary | ICD-10-CM

## 2018-07-19 DIAGNOSIS — Z0000 Encounter for general adult medical examination without abnormal findings: Secondary | ICD-10-CM

## 2018-07-19 DIAGNOSIS — E559 Vitamin D deficiency, unspecified: Secondary | ICD-10-CM | POA: Diagnosis not present

## 2018-07-19 LAB — HEPATIC FUNCTION PANEL
ALBUMIN: 4.1 g/dL (ref 3.5–5.2)
ALT: 15 U/L (ref 0–35)
AST: 15 U/L (ref 0–37)
Alkaline Phosphatase: 60 U/L (ref 39–117)
Bilirubin, Direct: 0.1 mg/dL (ref 0.0–0.3)
TOTAL PROTEIN: 7 g/dL (ref 6.0–8.3)
Total Bilirubin: 0.4 mg/dL (ref 0.2–1.2)

## 2018-07-19 LAB — BASIC METABOLIC PANEL
BUN: 11 mg/dL (ref 6–23)
CALCIUM: 9.3 mg/dL (ref 8.4–10.5)
CHLORIDE: 103 meq/L (ref 96–112)
CO2: 27 meq/L (ref 19–32)
CREATININE: 0.71 mg/dL (ref 0.40–1.20)
GFR: 112.6 mL/min (ref >60.00)
Glucose, Bld: 87 mg/dL (ref 70–99)
Potassium: 4 mEq/L (ref 3.5–5.1)
Sodium: 137 mEq/L (ref 135–145)

## 2018-07-19 LAB — CBC WITH DIFFERENTIAL/PLATELET
BASOS ABS: 0.1 10*3/uL (ref 0.0–0.1)
Basophils Relative: 0.8 % (ref 0.0–3.0)
EOS ABS: 0.2 10*3/uL (ref 0.0–0.7)
Eosinophils Relative: 2.4 % (ref 0.0–5.0)
HCT: 41.1 % (ref 36.0–46.0)
Hemoglobin: 13.3 g/dL (ref 12.0–15.0)
LYMPHS ABS: 2.5 10*3/uL (ref 0.7–4.0)
LYMPHS PCT: 32.6 % (ref 12.0–46.0)
MCHC: 32.4 g/dL (ref 30.0–36.0)
MCV: 82.5 fl (ref 78.0–100.0)
Monocytes Absolute: 0.5 10*3/uL (ref 0.1–1.0)
Monocytes Relative: 5.9 % (ref 3.0–12.0)
NEUTROS ABS: 4.5 10*3/uL (ref 1.4–7.7)
NEUTROS PCT: 58.3 % (ref 43.0–77.0)
PLATELETS: 343 10*3/uL (ref 150.0–400.0)
RBC: 4.99 Mil/uL (ref 3.87–5.11)
RDW: 14.4 % (ref 11.5–15.5)
WBC: 7.7 10*3/uL (ref 4.0–10.5)

## 2018-07-19 LAB — LIPID PANEL
Cholesterol: 163 mg/dL (ref 0–200)
HDL: 47.2 mg/dL (ref >39.00)
LDL Cholesterol: 104 mg/dL — ABNORMAL HIGH (ref 0–99)
NONHDL: 116.19
Total CHOL/HDL Ratio: 3
Triglycerides: 59 mg/dL (ref 0.0–149.0)
VLDL: 11.8 mg/dL (ref 0.0–40.0)

## 2018-07-19 LAB — VITAMIN D 25 HYDROXY (VIT D DEFICIENCY, FRACTURES): VITD: 30.99 ng/mL (ref 30.00–100.00)

## 2018-07-19 LAB — TSH: TSH: 1.05 u[IU]/mL (ref 0.35–4.50)

## 2018-07-19 NOTE — Assessment & Plan Note (Signed)
Pt's PE WNL w/ exception of obesity.  UTD on GYN, Tdap.  Not able to have flu due to allergies.  Check labs.  Anticipatory guidance provided.

## 2018-07-19 NOTE — Progress Notes (Signed)
   Subjective:    Patient ID: Cheyenne Larsen, female    DOB: [birth year], 47 y.o.   MRN: [id number]  HPI CPE- UTD on pap, mammo.  Unable to get flu shot.   Review of Systems Patient reports no vision/ hearing changes, adenopathy,fever, weight change,  persistant/recurrent hoarseness , swallowing issues, chest pain, palpitations, edema, persistant/recurrent cough, hemoptysis, dyspnea (rest/exertional/paroxysmal nocturnal), gastrointestinal bleeding (melena, rectal bleeding), abdominal pain, significant heartburn, bowel changes, GU symptoms (dysuria, hematuria, incontinence), Gyn symptoms (abnormal  bleeding, pain),  syncope, focal weakness, memory loss, numbness & tingling, skin/hair/nail changes, abnormal bruising or bleeding, anxiety, or depression.     Objective:   Physical Exam General Appearance:    Alert, cooperative, no distress, appears stated age  Head:    Normocephalic, without obvious abnormality, atraumatic  Eyes:    PERRL, conjunctiva/corneas clear, EOM's intact, fundi    benign, both eyes  Ears:    Normal TM's and external ear canals, both ears  Nose:   Nares normal, septum midline, mucosa normal, no drainage    or sinus tenderness  Throat:   Lips, mucosa, and tongue normal; teeth and gums normal  Neck:   Supple, symmetrical, trachea midline, no adenopathy;    Thyroid: no enlargement/tenderness/nodules  Back:     Symmetric, no curvature, ROM normal, no CVA tenderness  Lungs:     Clear to auscultation bilaterally, respirations unlabored  Chest Wall:    No tenderness or deformity   Heart:    Regular rate and rhythm, S1 and S2 normal, no murmur, rub   or gallop  Breast Exam:    Deferred to GYN  Abdomen:     Soft, non-tender, bowel sounds active all four quadrants,    no masses, no organomegaly  Genitalia:    Deferred to GYN  Rectal:    Extremities:   Extremities normal, atraumatic, no cyanosis or edema  Pulses:   2+ and symmetric all extremities  Skin:   Skin  color, texture, turgor normal, no rashes or lesions  Lymph nodes:   Cervical, supraclavicular, and axillary nodes normal  Neurologic:   CNII-XII intact, normal strength, sensation and reflexes    throughout          Assessment & Plan:

## 2018-07-19 NOTE — Patient Instructions (Signed)
Follow up in 1 year or as needed We'll notify you of your lab results and make any changes if needed Continue to work on healthy diet and regular exercise- you can do it! Call with any questions or concerns Happy Holidays!!!

## 2018-07-19 NOTE — Assessment & Plan Note (Signed)
Ongoing issue for pt.  She is down 4 lbs since last visit.  Applauded her efforts.  Check labs to risk stratify.  Encouraged healthy diet and regular exercise.  Will follow.

## 2018-07-19 NOTE — Assessment & Plan Note (Signed)
Pt has hx of this.  Check labs and replete prn. 

## 2018-07-28 IMAGING — MG 2D DIGITAL DIAGNOSTIC UNILATERAL RIGHT MAMMOGRAM WITH CAD AND AD
6 series · 6 of 14 positions shown · non-contrast
Comparison: Previous exam(s).

CLINICAL DATA: Screening recall for a possible right breast
mass.The patient has no recollection of any trauma to the breast.

EXAM:
2D DIGITAL DIAGNOSTIC UNILATERAL RIGHT MAMMOGRAM WITH CAD AND
ADJUNCT TOMO
RIGHT BREAST ULTRASOUND

[R CC synth-2D]
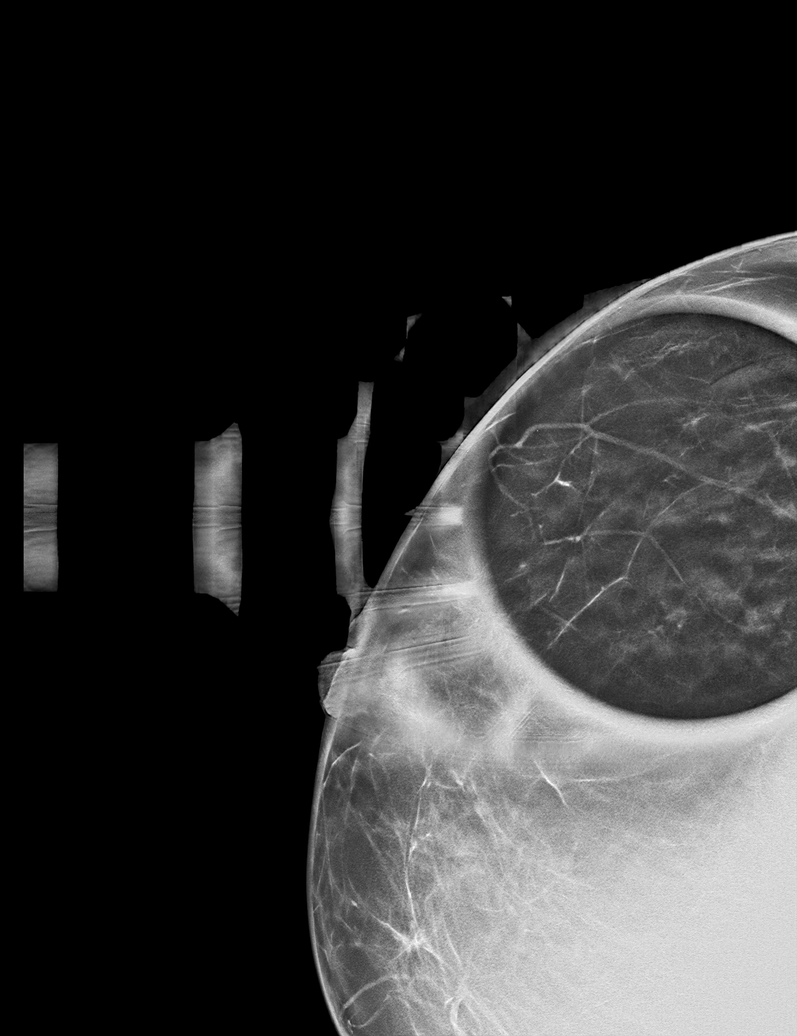

[R CC]
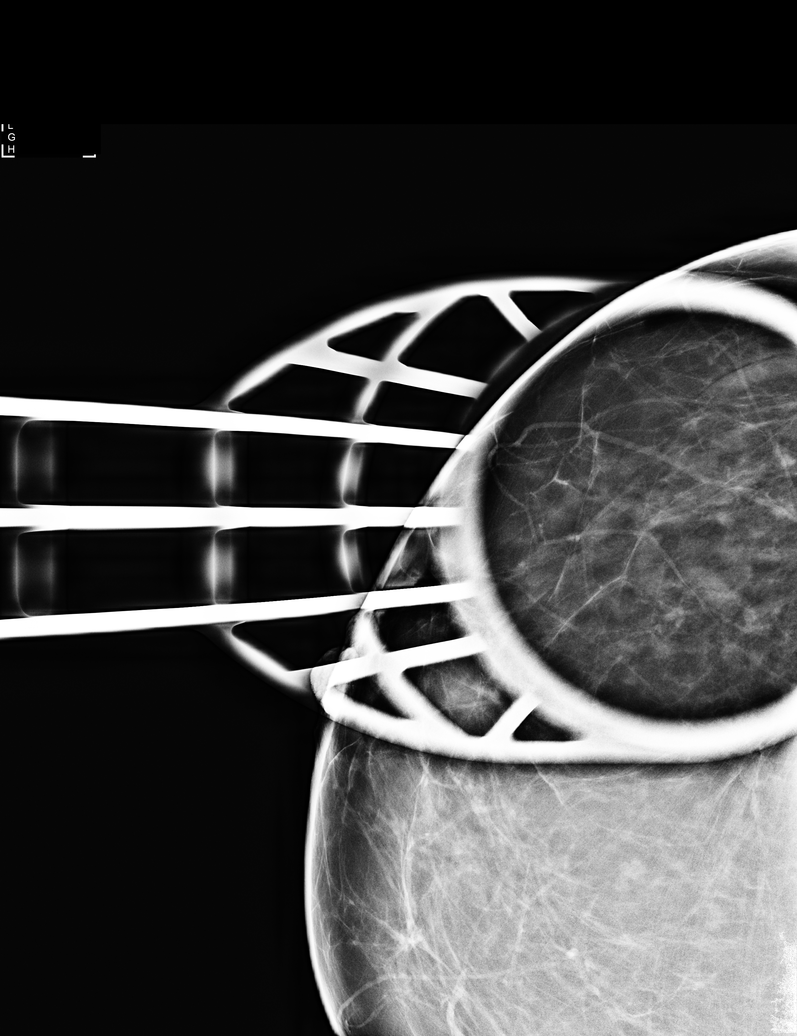

[R MLO synth-2D]
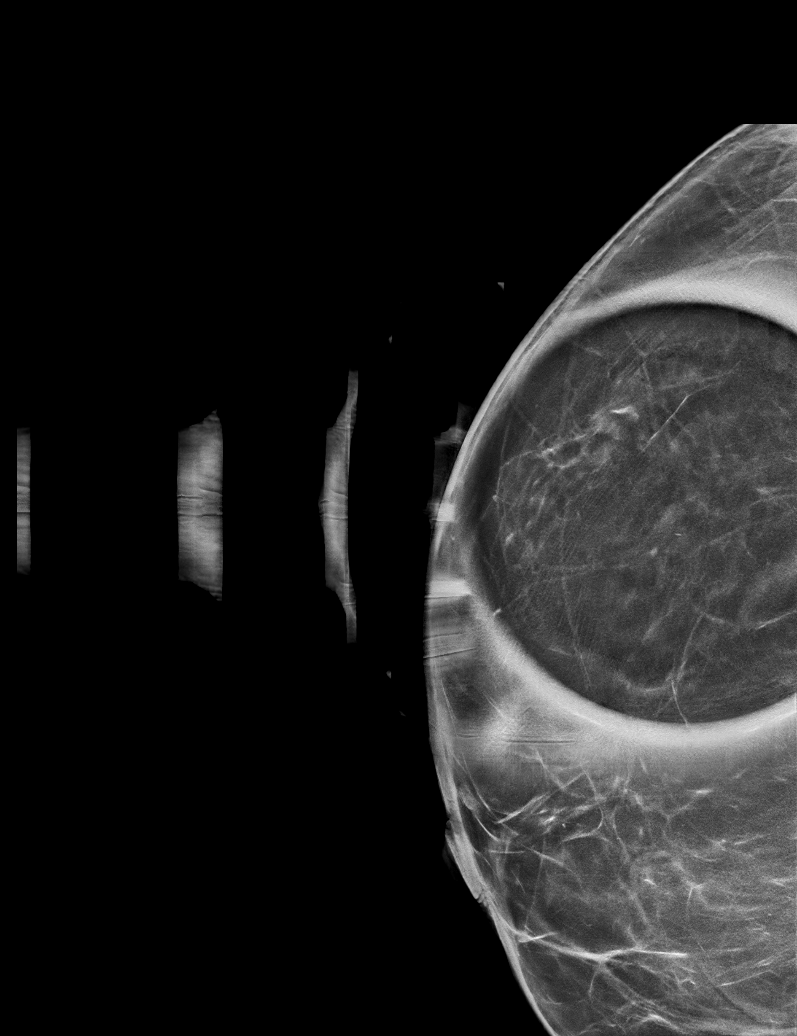

[R MLO]
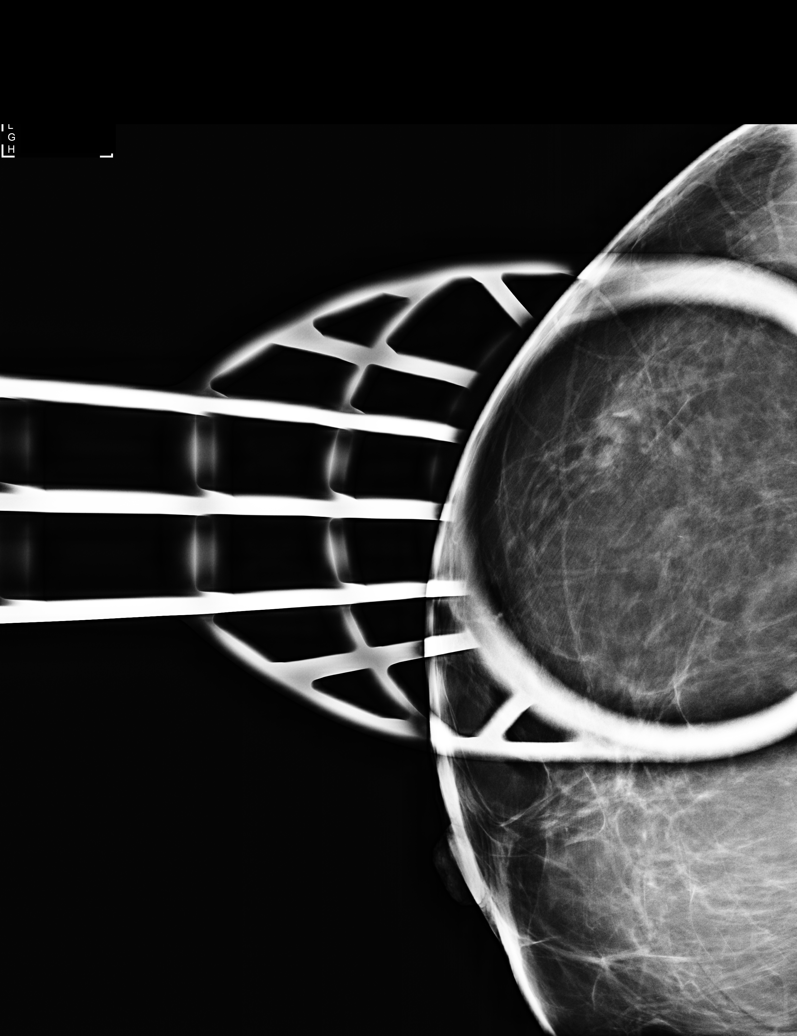

[R MLO tomo · tomo slice 39/78.0]
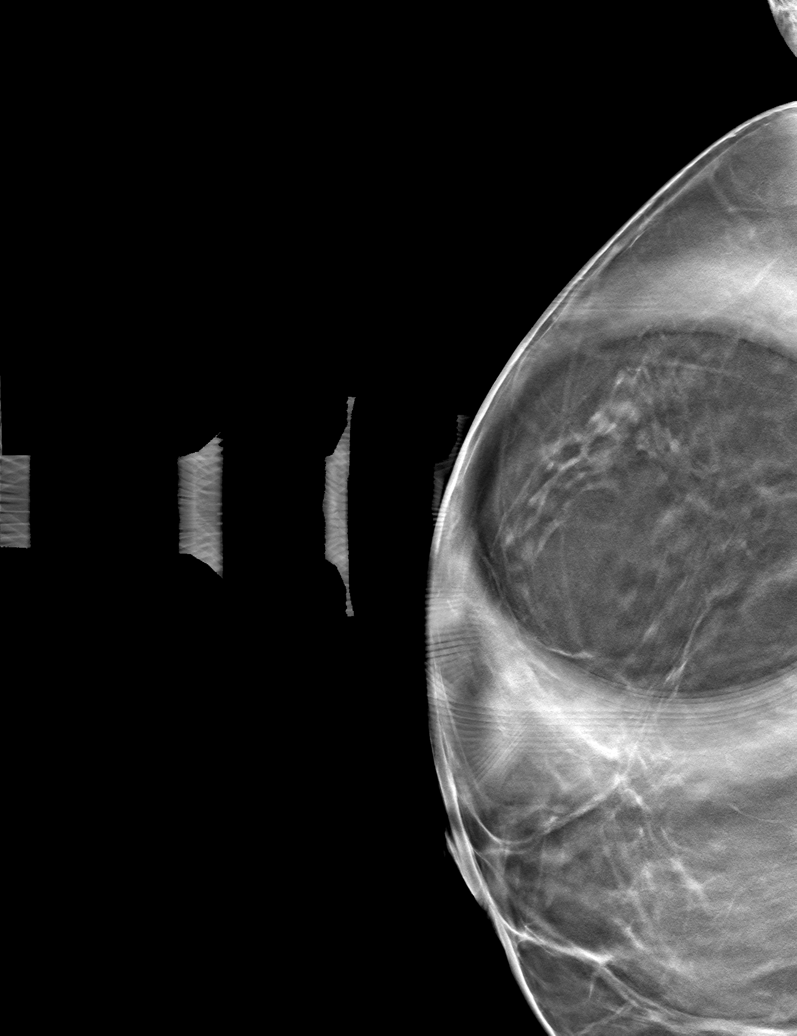

[R CC tomo · tomo slice 30/59.0]
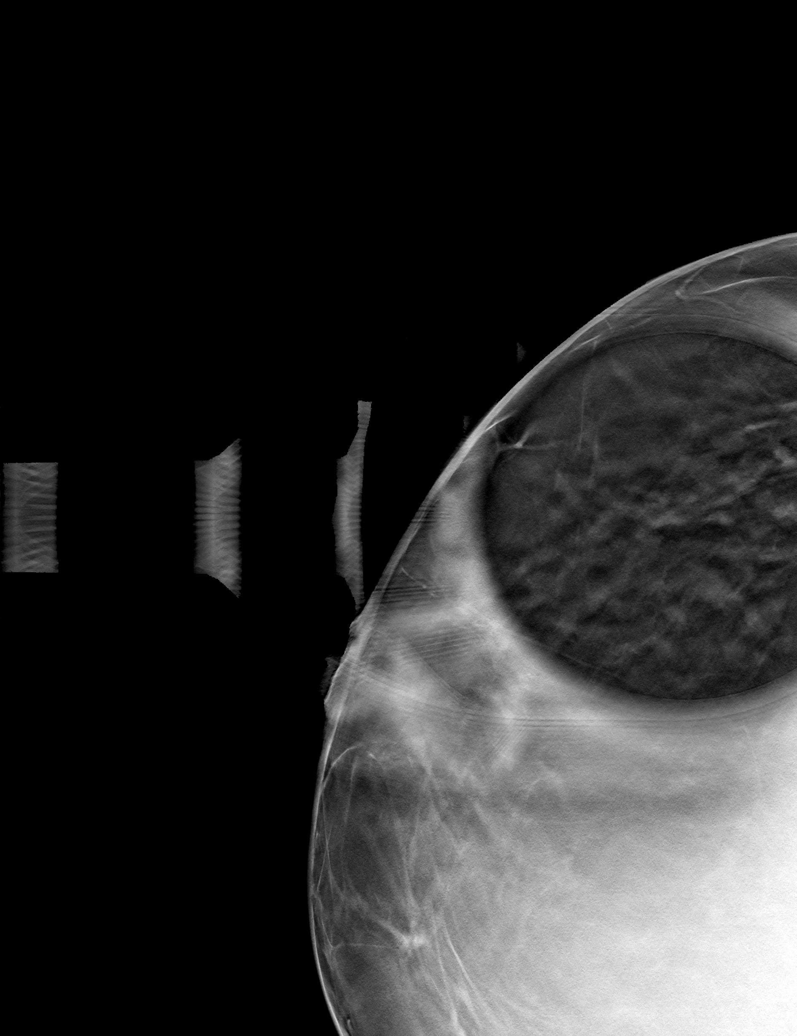

[6 of 14 positions shown; findings below may reference images not displayed]

ACR Breast Density Category b: There are scattered areas of
fibroglandular density.
FINDINGS: In the superior right breast, middle to anterior depth there is a 6
mm superficial low-density mass with indistinct margins.

Mammographic images were processed with CAD.

Physical exam of the superior right breast demonstrates no discrete
palpable masses.

Ultrasound targeted to the right breast at 12 o'clock, 6 cm from the
nipple demonstrates a superficial heterogeneous mass with indistinct
margins measuring approximately 1.1 x 0.4 x 0.9 cm. This has the
sonographic appearance alone early fat necrosis.
IMPRESSION: The mass identified in the superficial superior right breast is
favored to represent early fat necrosis and is probably benign.

RECOMMENDATION:
Six week follow-up right breast diagnostic mammogram and possible
ultrasound is recommended.

I have discussed the findings and recommendations with the patient.
Results were also provided in writing at the conclusion of the
visit. If applicable, a reminder letter will be sent to the patient
regarding the next appointment.

BI-RADS CATEGORY  3: Probably benign.

## 2018-11-14 ENCOUNTER — Other Ambulatory Visit: Payer: Self-pay

## 2018-11-14 ENCOUNTER — Encounter: Payer: Self-pay | Admitting: Family Medicine

## 2018-11-14 ENCOUNTER — Telehealth: Payer: Self-pay

## 2018-11-14 ENCOUNTER — Ambulatory Visit (INDEPENDENT_AMBULATORY_CARE_PROVIDER_SITE_OTHER): Payer: Managed Care, Other (non HMO) | Admitting: Family Medicine

## 2018-11-14 VITALS — BP 128/81 | HR 78 | Temp 98.5°F | Resp 16 | Ht 60.0 in | Wt 190.4 lb

## 2018-11-14 DIAGNOSIS — R220 Localized swelling, mass and lump, head: Secondary | ICD-10-CM | POA: Diagnosis not present

## 2018-11-14 DIAGNOSIS — R221 Localized swelling, mass and lump, neck: Secondary | ICD-10-CM

## 2018-11-14 DIAGNOSIS — L659 Nonscarring hair loss, unspecified: Secondary | ICD-10-CM | POA: Diagnosis not present

## 2018-11-14 NOTE — Telephone Encounter (Signed)
Called and pt was informed. Appt made for today at 3pm.

## 2018-11-14 NOTE — Patient Instructions (Signed)
Follow up by phone or MyChart in 1-2 weeks to let me know how the swelling is going We'll notify you of your lab results and make any changes if needed Apply warm compresses to the neck to improve swelling Suck on sour candies or chew sugar free gum to improve saliva production Drink plenty of fluids Call with any questions or concerns Stay Safe!

## 2018-11-14 NOTE — Telephone Encounter (Signed)
Patient called in stating that she has history of thyroid problems. Her neck, especially under her chin, is extremely swollen. Please advise if patient needs to come in or do e-visit.

## 2018-11-14 NOTE — Telephone Encounter (Signed)
Pt will need a visit to assess this

## 2018-11-14 NOTE — Progress Notes (Signed)
   Subjective:    Patient ID: Cheyenne Larsen, female    DOB: [birth year], 48 y.o.   MRN: [id number]  HPI Neck swelling- pt reports area underneath her chin was 'real swollen' over the weekend.  Pt has hx of small thyroid nodule 4 mm in L isthmus.  Reports increased hair loss.  No TTP.  No fevers.  Some sweats recently.     Review of Systems For ROS see HPI     Objective:   Physical Exam Vitals signs reviewed.  Constitutional:      General: She is not in acute distress.    Appearance: She is obese. She is not ill-appearing.  HENT:     Head: Normocephalic and atraumatic.     Comments: Mild thinning of hair at part    Mouth/Throat:     Pharynx: Oropharynx is clear.  Neck:     Musculoskeletal: No muscular tenderness.     Comments: Large neck with skin folds making exam somewhat difficult, but small area of fullness in location of submandibular gland.  No TTP Skin:    General: Skin is warm and dry.  Neurological:     General: No focal deficit present.     Mental Status: She is alert and oriented to person, place, and time.  Psychiatric:        Mood and Affect: Mood normal.        Behavior: Behavior normal.        Thought Content: Thought content normal.           Assessment & Plan:  Submandibular swelling- new.  No TTP.  Suspect some fullness of submandibular gland vs dermal cyst.  Area is not consistent w/ thyromegaly and does not feel like an enlarged LN.  Unable to image at this time given restrictions put in place due to COVID-19.  If area doesn't return to normal size/shape will proceed w/ imaging.  Pt expressed understanding and is in agreement w/ plan.   Hair loss- check TSH level and tx prn.

## 2018-11-15 LAB — CBC WITH DIFFERENTIAL/PLATELET
Basophils Absolute: 0.1 10*3/uL (ref 0.0–0.1)
Basophils Relative: 0.7 % (ref 0.0–3.0)
EOS PCT: 3.8 % (ref 0.0–5.0)
Eosinophils Absolute: 0.3 10*3/uL (ref 0.0–0.7)
HCT: 39.7 % (ref 36.0–46.0)
HEMOGLOBIN: 13 g/dL (ref 12.0–15.0)
LYMPHS ABS: 3.3 10*3/uL (ref 0.7–4.0)
Lymphocytes Relative: 41.5 % (ref 12.0–46.0)
MCHC: 32.7 g/dL (ref 30.0–36.0)
MCV: 82.6 fl (ref 78.0–100.0)
MONO ABS: 0.6 10*3/uL (ref 0.1–1.0)
Monocytes Relative: 8 % (ref 3.0–12.0)
Neutro Abs: 3.6 10*3/uL (ref 1.4–7.7)
Neutrophils Relative %: 46 % (ref 43.0–77.0)
PLATELETS: 338 10*3/uL (ref 150.0–400.0)
RBC: 4.8 Mil/uL (ref 3.87–5.11)
RDW: 14.3 % (ref 11.5–15.5)
WBC: 7.9 10*3/uL (ref 4.0–10.5)

## 2018-11-15 LAB — TSH: TSH: 1.47 u[IU]/mL (ref 0.35–4.50)

## 2019-07-26 LAB — HM MAMMOGRAPHY: HM Mammogram: NORMAL (ref 0–4)

## 2019-08-09 ENCOUNTER — Ambulatory Visit (INDEPENDENT_AMBULATORY_CARE_PROVIDER_SITE_OTHER): Payer: Managed Care, Other (non HMO) | Admitting: Family Medicine

## 2019-08-09 ENCOUNTER — Encounter: Payer: Self-pay | Admitting: Family Medicine

## 2019-08-09 ENCOUNTER — Other Ambulatory Visit: Payer: Self-pay

## 2019-08-09 VITALS — Ht 60.0 in | Wt 195.0 lb

## 2019-08-09 DIAGNOSIS — E559 Vitamin D deficiency, unspecified: Secondary | ICD-10-CM | POA: Diagnosis not present

## 2019-08-09 DIAGNOSIS — Z0000 Encounter for general adult medical examination without abnormal findings: Secondary | ICD-10-CM

## 2019-08-09 DIAGNOSIS — E669 Obesity, unspecified: Secondary | ICD-10-CM

## 2019-08-09 NOTE — Progress Notes (Signed)
I have discussed the procedure for the virtual visit with the patient who has given consent to proceed with assessment and treatment.   Pt unable to obtain vitals.   Cheyenne Larsen, CMA     

## 2019-08-09 NOTE — Progress Notes (Signed)
   Virtual Visit via Video   I connected with patient on 08/09/19 at  1:30 PM EST by a video enabled telemedicine application and verified that I am speaking with the correct person using two identifiers.  Location patient: Home Location provider: Acup[facility]urist, Office Persons participating in the virtual visit: Patient, Provider, [place] (Jess B)  I discussed the limitations of evaluation and management by telemedicine and the availability of in person appointments. The patient expressed understanding and agreed to proceed.  Subjective:   HPI:   CPE- UTD on mammo, Tdap.  Unable to get flu shot due to allergy.  No need for pap due to hysterectomy.  Pt has decreased her regular walking due to the weather change.  Attempting to get 6,000 steps daily.  ROS:  Patient reports no vision/ hearing changes, adenopathy,fever, weight change,  persistant/recurrent hoarseness , swallowing issues, chest pain, palpitations, edema, persistant/recurrent cough, hemoptysis, dyspnea (rest/exertional/paroxysmal nocturnal), gastrointestinal bleeding (melena, rectal bleeding), abdominal pain, significant heartburn, bowel changes, GU symptoms (dysuria, hematuria, incontinence), Gyn symptoms (abnormal  bleeding, pain),  syncope, focal weakness, memory loss, numbness & tingling, skin/hair/nail changes, abnormal bruising or bleeding, anxiety, or depression.   Patient Active Problem List   Diagnosis Date Noted  . Vitamin D deficiency 07/19/2018  . Status post abdominal hysterectomy 03/31/2016  . Esophageal dysphagia 02/19/2016  . Obesity (BMI 30-39.9) 11/21/2014  . Hemorrhoids 01/08/2012  . Family history of blood clots 01/08/2012  . Routine general medical examination at a health care facility 01/08/2012  . GERD (gastroesophageal reflux disease) 01/08/2012  . THYROMEGALY 10/17/2010    Social History   Tobacco Use  . Smoking status: Never Smoker  . Smokeless tobacco: Never Used  Substance Use Topics  .  Alcohol use: No    Alcohol/week: 0.0 standard drinks    Current Outpatient Medications:  .  Ascorbic Acid (VITAMIN C PO), Take by mouth., Disp: , Rfl:  .  ELDERBERRY PO, Take by mouth., Disp: , Rfl:  .  Multiple Vitamin (MULTIVITAMIN) tablet, Take 1 tablet by mouth daily., Disp: , Rfl:  .  omeprazole (PRILOSEC) 40 MG capsule, Take 1 capsule (40 mg total) by mouth daily., Disp: 90 capsule, Rfl: 0 .  timolol (TIMOPTIC) 0.5 % ophthalmic solution, , Disp: , Rfl:  .  VITAMIN D PO, Take by mouth., Disp: , Rfl:  .  VYZULTA 0.024 % SOLN, PLACE 1 GTT OU QD QHS., Disp: , Rfl:   Allergies  Allergen Reactions  . Influenza Vaccines Other (See Comments)    Numbness in arms (neuropathic reaction)    Objective:   Ht 5' (1.524 m)   Wt 195 lb (88.5 kg)   LMP 12/31/2011   BMI 38.08 kg/m   AAOx3, NAD Obese NCAT, EOMI No obvious CN deficits Coloring WNL Pt is able to speak clearly, coherently without shortness of breath or increased work of breathing.  Thought process is linear.  Mood is appropriate.   Assessment and Plan:   See Problem Based Charting   Annye Asa, MD 08/09/2019

## 2019-08-09 NOTE — Assessment & Plan Note (Signed)
Pt has hx of this.  Check labs and replete prn. 

## 2019-08-09 NOTE — Assessment & Plan Note (Signed)
Ongoing issue for pt.  Discussed need for healthy diet and regular exercise.  Check labs to risk stratify.  Will follow. 

## 2019-08-09 NOTE — Assessment & Plan Note (Signed)
Pt's limited video CPE WNL w/ exception of obesity.  UTD on mammo, Tdap.  Pt wants to call after her birthday to discuss colonoscopy referral.  Check labs.  Anticipatory guidance provided.

## 2019-08-17 ENCOUNTER — Other Ambulatory Visit: Payer: Self-pay

## 2019-08-17 ENCOUNTER — Ambulatory Visit (INDEPENDENT_AMBULATORY_CARE_PROVIDER_SITE_OTHER): Payer: Managed Care, Other (non HMO)

## 2019-08-17 DIAGNOSIS — E559 Vitamin D deficiency, unspecified: Secondary | ICD-10-CM

## 2019-08-17 DIAGNOSIS — E669 Obesity, unspecified: Secondary | ICD-10-CM | POA: Diagnosis not present

## 2019-08-17 NOTE — Addendum Note (Signed)
Addended by: Doran Clay A on: 08/17/2019 08:22 AM   Modules accepted: Orders

## 2019-08-18 LAB — HEPATIC FUNCTION PANEL
AG Ratio: 1.7 (calc) (ref 1.0–2.5)
ALT: 18 U/L (ref 6–29)
AST: 15 U/L (ref 10–35)
Albumin: 3.9 g/dL (ref 3.6–5.1)
Alkaline phosphatase (APISO): 69 U/L (ref 31–125)
Bilirubin, Direct: 0.1 mg/dL (ref 0.0–0.2)
Globulin: 2.3 g/dL (calc) (ref 1.9–3.7)
Indirect Bilirubin: 0.1 mg/dL (calc) — ABNORMAL LOW (ref 0.2–1.2)
Total Bilirubin: 0.2 mg/dL (ref 0.2–1.2)
Total Protein: 6.2 g/dL (ref 6.1–8.1)

## 2019-08-18 LAB — CBC WITH DIFFERENTIAL/PLATELET
Absolute Monocytes: 521 cells/uL (ref 200–950)
Basophils Absolute: 42 cells/uL (ref 0–200)
Basophils Relative: 0.5 %
Eosinophils Absolute: 428 cells/uL (ref 15–500)
Eosinophils Relative: 5.1 %
HCT: 40 % (ref 35.0–45.0)
Hemoglobin: 12.7 g/dL (ref 11.7–15.5)
Lymphs Abs: 2654 cells/uL (ref 850–3900)
MCH: 26.4 pg — ABNORMAL LOW (ref 27.0–33.0)
MCHC: 31.8 g/dL — ABNORMAL LOW (ref 32.0–36.0)
MCV: 83.2 fL (ref 80.0–100.0)
MPV: 11.2 fL (ref 7.5–12.5)
Monocytes Relative: 6.2 %
Neutro Abs: 4754 cells/uL (ref 1500–7800)
Neutrophils Relative %: 56.6 %
Platelets: 371 10*3/uL (ref 140–400)
RBC: 4.81 10*6/uL (ref 3.80–5.10)
RDW: 12.8 % (ref 11.0–15.0)
Total Lymphocyte: 31.6 %
WBC: 8.4 10*3/uL (ref 3.8–10.8)

## 2019-08-18 LAB — LIPID PANEL
Cholesterol: 144 mg/dL (ref <200)
HDL: 38 mg/dL — ABNORMAL LOW (ref >=50)
LDL Cholesterol (Calc): 92 mg/dL (calc)
Non-HDL Cholesterol (Calc): 106 mg/dL (calc) (ref <130)
Total CHOL/HDL Ratio: 3.8 (calc) (ref <5.0)
Triglycerides: 53 mg/dL (ref <150)

## 2019-08-18 LAB — BASIC METABOLIC PANEL
BUN: 13 mg/dL (ref 7–25)
CO2: 24 mmol/L (ref 20–32)
Calcium: 9.2 mg/dL (ref 8.6–10.2)
Chloride: 107 mmol/L (ref 98–110)
Creat: 0.92 mg/dL (ref 0.50–1.10)
Glucose, Bld: 104 mg/dL — ABNORMAL HIGH (ref 65–99)
Potassium: 4.6 mmol/L (ref 3.5–5.3)
Sodium: 140 mmol/L (ref 135–146)

## 2019-08-18 LAB — TSH: TSH: 1.34 mIU/L

## 2019-08-18 LAB — VITAMIN D 25 HYDROXY (VIT D DEFICIENCY, FRACTURES): Vit D, 25-Hydroxy: 37 ng/mL (ref 30–100)

## 2019-12-14 ENCOUNTER — Ambulatory Visit: Payer: Managed Care, Other (non HMO) | Attending: Internal Medicine

## 2019-12-14 DIAGNOSIS — Z23 Encounter for immunization: Secondary | ICD-10-CM

## 2020-01-08 ENCOUNTER — Ambulatory Visit: Payer: Managed Care, Other (non HMO) | Attending: Internal Medicine

## 2020-01-08 DIAGNOSIS — Z23 Encounter for immunization: Secondary | ICD-10-CM

## 2020-01-08 NOTE — Progress Notes (Signed)
   Covid-19 Vaccination Clinic  Name:  Cheyenne Larsen    MRN: [id number] DOB: 26-Apr-1971  01/08/2020  [name] was observed post Covid-19 immunization for 15 minutes without incident. She was provided with Vaccine Information Sheet and instruction to access the V-Safe system.   [name] was instructed to call 911 with any severe reactions post vaccine: Marland Kitchen Difficulty breathing  . Swelling of face and throat  . A fast heartbeat  . A bad rash all over body  . Dizziness and weakness   Immunizations Administered    Name Date Dose VIS Date Route   Pfizer COVID-19 Vaccine 01/08/2020  4:52 PM 0.3 mL 10/18/2018 Intramuscular   Manufacturer: [place]   Lot: [id number]   [place]: [id number]

## 2020-01-23 ENCOUNTER — Other Ambulatory Visit: Payer: Self-pay

## 2020-01-23 ENCOUNTER — Encounter: Payer: Self-pay | Admitting: Emergency Medicine

## 2020-01-23 ENCOUNTER — Ambulatory Visit
Admission: EM | Admit: 2020-01-23 | Discharge: 2020-01-23 | Disposition: A | Discharge status: Home / Self Care | Payer: Managed Care, Other (non HMO) | Attending: Emergency Medicine | Admitting: Emergency Medicine

## 2020-01-23 DIAGNOSIS — K047 Periapical abscess without sinus: Secondary | ICD-10-CM

## 2020-01-23 MED ORDER — AMOXICILLIN 500 MG PO TABS: 500.0000 mg | ORAL_TABLET | ORAL | 0 refills | Status: DC

## 2020-01-23 NOTE — Discharge Instructions (Signed)
Take antibiotic as directed: Please read instructions. Keep follow-up appointment with dentist in July, or sooner if able. Return for worsening pain, swelling, fever.

## 2020-01-23 NOTE — ED Provider Notes (Signed)
[facility]    CSN: [id number] Arrival date & time: 01/23/20  1913      History   Chief Complaint Chief Complaint  Patient presents with  . Dental Pain    HPI Cheyenne Larsen is a 49 y.o. female with history of migraines, fatty liver disease, obesity presenting for left lower dental pain x3 weeks.  Patient has noticed gingival swelling over the last few days.  Has been trying ibuprofen, BC topically for the last few days without significant relief.  Denies fever, difficulty breathing or swallowing, chest pain.  Has dental appointment in July.   Past Medical History:  Diagnosis Date  . Bilateral leg edema   . Fatty liver   . GERD (gastroesophageal reflux disease)   . Glaucoma   . Headache    Migraines  . Heavy menses     Patient Active Problem List   Diagnosis Date Noted  . Vitamin D deficiency 07/19/2018  . Status post abdominal hysterectomy 03/31/2016  . Esophageal dysphagia 02/19/2016  . Obesity (BMI 30-39.9) 11/21/2014  . Hemorrhoids 01/08/2012  . Family history of blood clots 01/08/2012  . Routine general medical examination at a health care facility 01/08/2012  . GERD (gastroesophageal reflux disease) 01/08/2012  . THYROMEGALY 10/17/2010    Past Surgical History:  Procedure Laterality Date  . ABDOMINAL HYSTERECTOMY N/A 03/31/2016   Procedure: HYSTERECTOMY ABDOMINAL;  Surgeon: Sanjuana Kava, MD;  Location: [facility];  Service: Gynecology;  Laterality: N/A;  . CESAREAN SECTION    . CYSTOSCOPY N/A 03/31/2016   Procedure: CYSTOSCOPY;  Surgeon: Sanjuana Kava, MD;  Location: [facility];  Service: Gynecology;  Laterality: N/A;    OB History    Gravida  2   Para  2   Term      Preterm      AB      Living  2     SAB      TAB      Ectopic      Multiple      Live Births               Home Medications    Prior to Admission medications   Medication Sig Start Date End Date Taking? Authorizing Provider  amoxicillin (AMOXIL) 500 MG  tablet Take 1 tablet (500 mg total) by mouth as directed. 2 tablets by mouth tonight, 1 tablet 3 times daily with food x3 days 01/23/20   Hall-Potvin, [place], PA-C  Ascorbic Acid (VITAMIN C PO) Take by mouth.    [provider]  ELDERBERRY PO Take by mouth.    [provider]  Multiple Vitamin (MULTIVITAMIN) tablet Take 1 tablet by mouth daily.    [provider]  omeprazole (PRILOSEC) 40 MG capsule Take 1 capsule (40 mg total) by mouth daily. 02/19/16   Midge Minium, MD  timolol (TIMOPTIC) 0.5 % ophthalmic solution  09/02/18   [provider]  VITAMIN D PO Take by mouth.    [provider]  VYZULTA 0.024 % SOLN PLACE 1 GTT OU QD QHS. 08/25/18   [provider]    Family History Family History  Problem Relation Age of Onset  . Hypertension Mother   . Hypertension Father   . Diabetes Father        type 2  . Glaucoma Father   . Heart disease Sister   . Heart attack Maternal Grandmother   . Cancer Maternal Grandfather  bone  . Ovarian cancer Paternal Grandmother   . Alzheimer's disease Paternal Grandfather     Social History Social History   Tobacco Use  . Smoking status: Never Smoker  . Smokeless tobacco: Never Used  Substance Use Topics  . Alcohol use: No    Alcohol/week: 0.0 standard drinks  . Drug use: No     Allergies   Influenza vaccines   Review of Systems As per HPI   Physical Exam Triage Vital Signs ED Triage Vitals  Enc Vitals Group     BP      Pulse      Resp      Temp      Temp src      SpO2      Weight      Height      Head Circumference      Peak Flow      Pain Score      Pain Loc      Pain Edu?      Excl. in [place]?    No data found.  Updated Vital Signs BP (!) 157/96 (BP Location: Right Arm)   Pulse 77   Temp (!) 97.2 F (36.2 C) (Oral)   Resp 18   LMP 12/31/2011   SpO2 98%   Visual Acuity Right Eye Distance:   Left Eye Distance:   Bilateral Distance:    Right Eye  Near:   Left Eye Near:    Bilateral Near:     Physical Exam Constitutional:      General: She is not in acute distress. HENT:     Head: Normocephalic and atraumatic.     Right Ear: Tympanic membrane, ear canal and external ear normal.     Left Ear: Tympanic membrane, ear canal and external ear normal.     Mouth/Throat:     Mouth: Mucous membranes are moist.     Pharynx: No oropharyngeal exudate or posterior oropharyngeal erythema.     Comments: Fair dentition.  Left lower rear molar with significant decay and tenderness and surrounding gingival erythema and edema.  Exquisite TTP. Eyes:     General: No scleral icterus.    Pupils: Pupils are equal, round, and reactive to light.  Cardiovascular:     Rate and Rhythm: Normal rate.  Pulmonary:     Effort: Pulmonary effort is normal.  Skin:    Coloration: Skin is not jaundiced or pale.  Neurological:     Mental Status: She is alert and oriented to person, place, and time.      UC Treatments / Results  Labs (all labs ordered are listed, but only abnormal results are displayed) Labs Reviewed - No data to display  EKG   Radiology No results found.  Procedures Procedures (including critical care time)  Medications Ordered in UC Medications - No data to display  Initial Impression / Assessment and Plan / UC Course  I have reviewed the triage vital signs and the nursing notes.  Pertinent labs & imaging results that were available during my care of the patient were reviewed by me and considered in my medical decision making (see chart for details).     Patient febrile, nontoxic in office today.  H&P concerning for dental abscess.  Will start amoxicillin as outlined below.  Return precautions discussed, patient verbalized understanding and is agreeable to plan. Final Clinical Impressions(s) / UC Diagnoses   Final diagnoses:  Dental abscess     Discharge Instructions  Take antibiotic as directed: Please read  instructions. Keep follow-up appointment with dentist in July, or sooner if able. Return for worsening pain, swelling, fever.    ED Prescriptions    Medication Sig Dispense Auth. Provider   amoxicillin (AMOXIL) 500 MG tablet Take 1 tablet (500 mg total) by mouth as directed. 2 tablets by mouth tonight, 1 tablet 3 times daily with food x3 days 11 tablet Hall-Potvin, [place], PA-C     I have reviewed the PDMP during this encounter.   Hall-Potvin, [place], [place] 01/23/20 1941

## 2020-01-23 NOTE — ED Triage Notes (Signed)
Pt here for left sided dental abscess x 3 weeks worse over last couple of days

## 2020-03-05 ENCOUNTER — Telehealth (INDEPENDENT_AMBULATORY_CARE_PROVIDER_SITE_OTHER): Payer: PRIVATE HEALTH INSURANCE | Admitting: Physician Assistant

## 2020-03-05 ENCOUNTER — Other Ambulatory Visit: Payer: Self-pay

## 2020-03-05 ENCOUNTER — Encounter: Payer: Self-pay | Admitting: Physician Assistant

## 2020-03-05 DIAGNOSIS — R195 Other fecal abnormalities: Secondary | ICD-10-CM

## 2020-03-05 DIAGNOSIS — K649 Unspecified hemorrhoids: Secondary | ICD-10-CM | POA: Diagnosis not present

## 2020-03-05 DIAGNOSIS — R152 Fecal urgency: Secondary | ICD-10-CM | POA: Diagnosis not present

## 2020-03-05 MED ORDER — HYDROCORTISONE ACETATE 25 MG RE SUPP: 25.0000 mg | Freq: Two times a day (BID) | RECTAL | 0 refills | Status: DC

## 2020-03-05 NOTE — Patient Instructions (Signed)
Instructions sent to MyChart

## 2020-03-05 NOTE — Progress Notes (Signed)
I have discussed the procedure for the virtual visit with the patient who has given consent to proceed with assessment and treatment.   [place] Dodgen S Caria Transue, CMA     

## 2020-03-05 NOTE — Progress Notes (Signed)
Virtual Visit via Video   I connected with patient on 03/05/20 at  9:00 AM EDT by a video enabled telemedicine application and verified that I am speaking with the correct person using two identifiers.  Location patient: Home Location provider: [place], Office Persons participating in the virtual visit: Patient, Provider, [place] (Patina Moore)  I discussed the limitations of evaluation and management by telemedicine and the availability of in person appointments. The patient expressed understanding and agreed to proceed.  Subjective:   HPI:   Patient presents via [place] today to discuss a few ongoing GI issues. Patient endorses about 3 months or so of episodic upper abdominal pain occurring about 3 times weekly lasting about 2 to 3 minutes.  Notes symptoms occur after meals but is not associated with heartburn, nausea, indigestion or vomiting.  Does note cramping sensation in the to defecate.  Will go to the bathroom and have loose stool.  Will feel better after this.  Denies any fever, chills, malaise or fatigue.  Denies night sweats or change in weight.  Patient notes on a regular basis she will have a bowel movement every 1 to 3 days, alternating between loose stool and hard stools with constipation.  Has history of recurring hemorrhoids over the past few years and has been noting occasional bright red blood when wiping, maybe twice weekly.  Denies melena or hematochezia.  Denies rectal pain.  In regards to diet she notes she has not been eating much recently as she is worried it will affect her stomach.  Yesterday she had a smoothie in the morning but this was about it.  Notes she tries to stay well-hydrated but is working 11 hours a day which makes it harder.  Patient does note significant increase in stressors over the past few months due to being understaffed at work.  Is working 11 hours a day, 6 days/week.  Of note patient denies having a prior screening colonoscopy.  Denies  family history of colorectal cancer.  ROS:   See pertinent positives and negatives per HPI.  Patient Active Problem List   Diagnosis Date Noted   Vitamin D deficiency 07/19/2018   Status post abdominal hysterectomy 03/31/2016   Esophageal dysphagia 02/19/2016   Obesity (BMI 30-39.9) 11/21/2014   Hemorrhoids 01/08/2012   Family history of blood clots 01/08/2012   Routine general medical examination at a health care facility 01/08/2012   GERD (gastroesophageal reflux disease) 01/08/2012   THYROMEGALY 10/17/2010    Social History   Tobacco Use   Smoking status: Never Smoker   Smokeless tobacco: Never Used  Substance Use Topics   Alcohol use: No    Alcohol/week: 0.0 standard drinks    Current Outpatient Medications:    Ascorbic Acid (VITAMIN C PO), Take by mouth., Disp: , Rfl:    ELDERBERRY PO, Take by mouth., Disp: , Rfl:    Multiple Vitamin (MULTIVITAMIN) tablet, Take 1 tablet by mouth daily., Disp: , Rfl:    omeprazole (PRILOSEC) 40 MG capsule, Take 1 capsule (40 mg total) by mouth daily., Disp: 90 capsule, Rfl: 0   timolol (TIMOPTIC) 0.5 % ophthalmic solution, , Disp: , Rfl:    VITAMIN D PO, Take by mouth., Disp: , Rfl:    VYZULTA 0.024 % SOLN, PLACE 1 GTT OU QD QHS., Disp: , Rfl:   Allergies  Allergen Reactions   Influenza Vaccines Other (See Comments)    Numbness in arms (neuropathic reaction)    Objective:   LMP 12/31/2011  Patient is well-developed, well-nourished in no acute distress.  Resting comfortably at home.  Head is normocephalic, atraumatic.  No labored breathing.  Speech is clear and coherent with logical content.  Patient is alert and oriented at baseline.   Assessment and Plan:   1. Change in stool 2. Fecal urgency X3 months.  Seems at least in part due to IBS with mixed constipation and diarrhea.  Needs further assessment.  Will check CBC, CMP and TSH level today.  Referral placed to gastroenterology for further  evaluation as she is due for colonoscopy anyway giving age but certainly due to ongoing GI issue.  Proper diet and bowel regimen reviewed with patient.  She is not getting enough fiber in her diet to help soften harder stool and to help formed loose stools. - CBC with Differential/Platelet - Comprehensive metabolic panel - TSH - Ambulatory referral to Gastroenterology  3. Hemorrhoids, unspecified hemorrhoid type Recurring.  Current bout x3 months.  Unable to examine via video visit.  We will restart her on hydrocortisone suppository.  Bowel regimen reviewed.  Referral to GI placed for further evaluation/management - Ambulatory referral to Gastroenterology - hydrocortisone (ANUSOL-HC) 25 MG suppository; Place 1 suppository (25 mg total) rectally 2 (two) times daily.  Dispense: 12 suppository; Refill: 0    Leeanne Rio, [place] 03/05/2020

## 2020-03-07 ENCOUNTER — Ambulatory Visit: Payer: PRIVATE HEALTH INSURANCE

## 2020-03-07 ENCOUNTER — Other Ambulatory Visit: Payer: Self-pay

## 2020-03-07 DIAGNOSIS — R152 Fecal urgency: Secondary | ICD-10-CM | POA: Diagnosis not present

## 2020-03-07 DIAGNOSIS — R195 Other fecal abnormalities: Secondary | ICD-10-CM | POA: Diagnosis not present

## 2020-03-07 LAB — TSH: TSH: 1.02 u[IU]/mL (ref 0.35–4.50)

## 2020-03-07 LAB — CBC WITH DIFFERENTIAL/PLATELET
Basophils Absolute: 0 10*3/uL (ref 0.0–0.1)
Basophils Relative: 0.4 % (ref 0.0–3.0)
Eosinophils Absolute: 0.1 10*3/uL (ref 0.0–0.7)
Eosinophils Relative: 1.9 % (ref 0.0–5.0)
HCT: 39.5 % (ref 36.0–46.0)
Hemoglobin: 12.6 g/dL (ref 12.0–15.0)
Lymphocytes Relative: 34.6 % (ref 12.0–46.0)
Lymphs Abs: 2.6 10*3/uL (ref 0.7–4.0)
MCHC: 31.9 g/dL (ref 30.0–36.0)
MCV: 84.2 fl (ref 78.0–100.0)
Monocytes Absolute: 0.4 10*3/uL (ref 0.1–1.0)
Monocytes Relative: 5.5 % (ref 3.0–12.0)
Neutro Abs: 4.3 10*3/uL (ref 1.4–7.7)
Neutrophils Relative %: 57.6 % (ref 43.0–77.0)
Platelets: 326 10*3/uL (ref 150.0–400.0)
RBC: 4.69 Mil/uL (ref 3.87–5.11)
RDW: 14.5 % (ref 11.5–15.5)
WBC: 7.5 10*3/uL (ref 4.0–10.5)

## 2020-03-07 LAB — COMPREHENSIVE METABOLIC PANEL
ALT: 18 U/L (ref 0–35)
AST: 13 U/L (ref 0–37)
Albumin: 4.3 g/dL (ref 3.5–5.2)
Alkaline Phosphatase: 81 U/L (ref 39–117)
BUN: 12 mg/dL (ref 6–23)
CO2: 27 mEq/L (ref 19–32)
Calcium: 9.4 mg/dL (ref 8.4–10.5)
Chloride: 109 mEq/L (ref 96–112)
Creatinine, Ser: 0.76 mg/dL (ref 0.40–1.20)
GFR: 97.28 mL/min (ref >60.00)
Glucose, Bld: 82 mg/dL (ref 70–99)
Potassium: 5.1 mEq/L (ref 3.5–5.1)
Sodium: 143 mEq/L (ref 135–145)
Total Bilirubin: 0.2 mg/dL (ref 0.2–1.2)
Total Protein: 6.8 g/dL (ref 6.0–8.3)

## 2020-04-22 ENCOUNTER — Ambulatory Visit (INDEPENDENT_AMBULATORY_CARE_PROVIDER_SITE_OTHER): Payer: PRIVATE HEALTH INSURANCE | Admitting: Physician Assistant

## 2020-04-22 ENCOUNTER — Encounter: Payer: Self-pay | Admitting: Physician Assistant

## 2020-04-22 VITALS — BP 120/90 | HR 77 | Ht 59.5 in | Wt 193.4 lb

## 2020-04-22 DIAGNOSIS — K625 Hemorrhage of anus and rectum: Secondary | ICD-10-CM

## 2020-04-22 DIAGNOSIS — R1314 Dysphagia, pharyngoesophageal phase: Secondary | ICD-10-CM

## 2020-04-22 DIAGNOSIS — R194 Change in bowel habit: Secondary | ICD-10-CM | POA: Diagnosis not present

## 2020-04-22 DIAGNOSIS — R1013 Epigastric pain: Secondary | ICD-10-CM | POA: Diagnosis not present

## 2020-04-22 DIAGNOSIS — K602 Anal fissure, unspecified: Secondary | ICD-10-CM

## 2020-04-22 DIAGNOSIS — K219 Gastro-esophageal reflux disease without esophagitis: Secondary | ICD-10-CM

## 2020-04-22 MED ORDER — AMBULATORY NON FORMULARY MEDICATION: 1 refills | Status: DC

## 2020-04-22 MED ORDER — NA SULFATE-K SULFATE-MG SULF 17.5-3.13-1.6 GM/177ML PO SOLN: 1.0000 | Freq: Once | ORAL | 0 refills | Status: AC

## 2020-04-22 NOTE — Progress Notes (Signed)
Agree with assessment and plan as outlined.  

## 2020-04-22 NOTE — Progress Notes (Signed)
Chief Complaint: Change in bowel habits, hemorrhoids, rectal bleeding, abdominal pain  HPI:    [name] is a 49 year old African-American female with a past medical history as listed below, who was referred to me by Midge Minium, MD for a complaint of rectal bleeding, abdominal pain change in bowel habits and hemorrhoids.      03/05/2020 patient had a video visit with her PCP and discussed ongoing GI issues with 3 months or so of episodic upper abdominal pain occurring 3 times a week lasting 2 to 3 minutes, these occurred after meals, then has a loose stool and feels better.  Alternates between constipation and diarrhea.  Noting occasional bright red blood on the toilet paper which she associated with hemorrhoids.  She was given suppositories for hemorrhoids.  She was referred to our office.    03/07/2020 TSH, CMP and CBC normal.    Today, the patient presents clinic and explains that over the years she has typically use fiber to help with the "my stomach".  Tells me this helped with keeping regular bowel movements but over the past couple of months to a year she has noticed an increase in issues.  Occasionally, maybe 2 to 3 days out of the week, has some left upper quadrant abdominal pain which typically results in his loose stool.  This comes on often after she has eaten but she has not been able to pinpoint anything specifically.  Due to this she has gone down to one meal a day due to inconvenience and not being around/worried of not being around a toilet.  Tells me oftentimes she will just have balls of stool.  Does have rectal pain with rectal bleeding when she is passing a stool.  Has tried using suppositories both prescribed and over-the-counter which have not really helped.  Some rectal itching from hemorrhoids.  Some issues when taking pills and feeling like they get stuck in her throat.  Currently on Omeprazole 40 mg once daily which takes care of her reflux symptoms.  Does take 2 Colace  at night and has started a probiotic recently.  Tells me she does not drink enough water in the day.    Also reports an increase in stress over the past year or so due to her job being "essential", and her working many long hours typically 6 days out of the week.    Denies fever, chills, weight loss or symptoms that awaken her from sleep.  Past Medical History:  Diagnosis Date  . Bilateral leg edema   . Fatty liver   . GERD (gastroesophageal reflux disease)   . Glaucoma   . Headache    Migraines  . Heavy menses     Past Surgical History:  Procedure Laterality Date  . ABDOMINAL HYSTERECTOMY N/A 03/31/2016   Procedure: HYSTERECTOMY ABDOMINAL;  Surgeon: Sanjuana Kava, MD;  Location: [facility];  Service: Gynecology;  Laterality: N/A;  . CESAREAN SECTION    . CYSTOSCOPY N/A 03/31/2016   Procedure: CYSTOSCOPY;  Surgeon: Sanjuana Kava, MD;  Location: [facility];  Service: Gynecology;  Laterality: N/A;    Current Outpatient Medications  Medication Sig Dispense Refill  . Ascorbic Acid (VITAMIN C PO) Take by mouth.    . ELDERBERRY PO Take by mouth.    . hydrocortisone (ANUSOL-HC) 25 MG suppository Place 1 suppository (25 mg total) rectally 2 (two) times daily. 12 suppository 0  . Multiple Vitamin (MULTIVITAMIN) tablet Take 1 tablet by mouth daily.    Marland Kitchen omeprazole (  PRILOSEC) 40 MG capsule Take 1 capsule (40 mg total) by mouth daily. 90 capsule 0  . timolol (TIMOPTIC) 0.5 % ophthalmic solution     . VITAMIN D PO Take by mouth.    . VYZULTA 0.024 % SOLN PLACE 1 GTT OU QD QHS.     No current facility-administered medications for this visit.    Allergies as of 04/22/2020 - Review Complete 03/05/2020  Allergen Reaction Noted  . Influenza vaccines Other (See Comments) 04/19/2017    Family History  Problem Relation Age of Onset  . Hypertension Mother   . Hypertension Father   . Diabetes Father        type 2  . Glaucoma Father   . Heart disease Sister   . Heart attack Maternal Grandmother   . Cancer  Maternal Grandfather        bone  . Ovarian cancer Paternal Grandmother   . Alzheimer's disease Paternal Grandfather     Social History   Socioeconomic History  . Marital status: Married    Spouse name: Not on file  . Number of children: Not on file  . Years of education: Not on file  . Highest education level: Not on file  Occupational History  . Not on file  Tobacco Use  . Smoking status: Never Smoker  . Smokeless tobacco: Never Used  Vaping Use  . Vaping Use: Never used  Substance and Sexual Activity  . Alcohol use: No    Alcohol/week: 0.0 standard drinks  . Drug use: No  . Sexual activity: Yes    Birth control/protection: Surgical    Comment: 1st intercourse 49 yo-Fewer than 5 partners-Tubal lig  Other Topics Concern  . Not on file  Social History Narrative  . Not on file   Social Determinants of Health   Financial Resource Strain:   . Difficulty of Paying Living Expenses: Not on file  Food Insecurity:   . Worried About Charity fundraiser in the Last Year: Not on file  . Ran Out of Food in the Last Year: Not on file  Transportation Needs:   . Lack of Transportation (Medical): Not on file  . Lack of Transportation (Non-Medical): Not on file  Physical Activity:   . Days of Exercise per Week: Not on file  . Minutes of Exercise per Session: Not on file  Stress:   . Feeling of Stress : Not on file  Social Connections:   . Frequency of Communication with Friends and Family: Not on file  . Frequency of Social Gatherings with Friends and Family: Not on file  . Attends Religious Services: Not on file  . Active Member of Clubs or Organizations: Not on file  . Attends Archivist Meetings: Not on file  . Marital Status: Not on file  Intimate Partner Violence:   . Fear of Current or Ex-Partner: Not on file  . Emotionally Abused: Not on file  . Physically Abused: Not on file  . Sexually Abused: Not on file    Review of Systems:    Constitutional: No  weight loss, fever or chills Skin: No rash Cardiovascular: No chest pain Respiratory: No SOB  Gastrointestinal: See HPI and otherwise negative Genitourinary: No dysuria Neurological: No headache, dizziness or syncope Musculoskeletal: No new muscle or joint pain Hematologic: No bruising Psychiatric: No history of depression or anxiety   Physical Exam:  Vital signs: BP 120/90 (BP Location: Left Arm, Patient Position: Sitting, Cuff Size: Normal)   Pulse 77  Ht 4' 11.5" (1.511 m) Comment: height measured without shoes  Wt 193 lb 6 oz (87.7 kg)   LMP 12/31/2011   BMI 38.40 kg/m   Constitutional:   Pleasant overweight AA female appears to be in NAD, Well developed, Well nourished, alert and cooperative Head:  Normocephalic and atraumatic. Eyes:   PEERL, EOMI. No icterus. Conjunctiva pink. Ears:  Normal auditory acuity. Neck:  Supple Throat: Oral cavity and pharynx without inflammation, swelling or lesion.  Respiratory: Respirations even and unlabored. Lungs clear to auscultation bilaterally.   No wheezes, crackles, or rhonchi.  Cardiovascular: Normal S1, S2. No MRG. Regular rate and rhythm. No peripheral edema, cyanosis or pallor.  Gastrointestinal:  Soft, nondistended, mild epigastric ttp. No rebound or guarding. Normal bowel sounds. No appreciable masses or hepatomegaly. Rectal:  External: two fissures, posterior and anterior (posterior appears to be healing), external hemorrhoids; Internal fullness, no mass or discharge Msk:  Symmetrical without gross deformities. Without edema, no deformity or joint abnormality.  Neurologic:  Alert and  oriented x4;  grossly normal neurologically.  Skin:   Dry and intact without significant lesions or rashes. Psychiatric:  Demonstrates good judgement and reason without abnormal affect or behaviors.  RELEVANT LABS AND IMAGING: CBC    Component Value Date/Time   WBC 7.5 03/07/2020 0907   RBC 4.69 03/07/2020 0907   HGB 12.6 03/07/2020 0907   HCT  39.5 03/07/2020 0907   PLT 326.0 03/07/2020 0907   MCV 84.2 03/07/2020 0907   MCH 26.4 (L) 08/17/2019 0854   MCHC 31.9 03/07/2020 0907   RDW 14.5 03/07/2020 0907   LYMPHSABS 2.6 03/07/2020 0907   MONOABS 0.4 03/07/2020 0907   EOSABS 0.1 03/07/2020 0907   BASOSABS 0.0 03/07/2020 0907    CMP     Component Value Date/Time   NA 143 03/07/2020 0907   K 5.1 03/07/2020 0907   CL 109 03/07/2020 0907   CO2 27 03/07/2020 0907   GLUCOSE 82 03/07/2020 0907   BUN 12 03/07/2020 0907   CREATININE 0.76 03/07/2020 0907   CREATININE 0.92 08/17/2019 0854   CALCIUM 9.4 03/07/2020 0907   PROT 6.8 03/07/2020 0907   ALBUMIN 4.3 03/07/2020 0907   AST 13 03/07/2020 0907   ALT 18 03/07/2020 0907   ALKPHOS 81 03/07/2020 0907   BILITOT 0.2 03/07/2020 0907   GFRNONAA >60 03/30/2016 1225   GFRAA >60 03/30/2016 1225    Assessment: 1.  Change in bowel habits: Radiates back-and-forth from constipation to diarrhea; consider IBS 2.  Abdominal pain: Consider relationship to IBS with cramping before loose stool 3.  Rectal bleeding: Likely related to below first hemorrhoids 4.  Anal fissure: 2 seen at time of exam today 5.  Epigastric pain: Consider gastritis versus other 6.  GERD: Controlled with Omeprazole 40 mg daily 7.  Dysphagia: Describes pills getting stuck in her throat; consider stricture versus other  Plan: 1.  Scheduled patient for diagnostic EGD with dilation and diagnostic colonoscopy given rectal bleeding and a change in bowel habits with Dr. Havery Moros in the [facility].  Did discuss risks, benefits, limitations and alternatives and the patient agrees to proceed. 2.  Continue Omeprazole 40 mg daily 3.  Prescribed Nitroglycerin ointment 0.125% to be applied 3 times daily x6-8 weeks for fissure 4.  Discussed sitz bath's for 15 to 20 minutes 3 times a day 5.  Recommend over-the-counter RectiCare cream with lidocaine as needed for pain 6.  Recommend patient start taking MiraLAX on a daily basis in the  morning,  discussed titration of this.  She can continue Colace if she feels this is helping as well as probiotic. 7.  Patient to follow in clinic per recommendations from Dr. Havery Moros after time of procedures.  Ellouise Newer, PA-C [facility] 04/22/2020, 8:34 AM  Cc: Midge Minium, MD

## 2020-04-22 NOTE — Patient Instructions (Addendum)
If you are age 50 or older, your body mass index should be between 23-30. Your Body mass index is 38.4 kg/m. If this is out of the aforementioned range listed, please consider follow up with your Primary Care Provider.  If you are age 21 or younger, your body mass index should be between 19-25. Your Body mass index is 38.4 kg/m. If this is out of the aformentioned range listed, please consider follow up with your Primary Care Provider.   You have been scheduled for an endoscopy and colonoscopy. Please follow the written instructions given to you at your visit today. Please pick up your prep supplies at the pharmacy within the next 1-3 days. If you use inhalers (even only as needed), please bring them with you on the day of your procedure.  We have sent a prescription for nitroglycerin 0.125% gel to [facility]. You should apply a pea size amount to your rectum three times daily x 6-8 weeks.  [facility]'s information is below: Address: [address] [place], [place] [address]  [phone]  *Please DO NOT go directly from our office to pick up this medication! Give the pharmacy 1 day to process the prescription as this is compounded and takes time to make.  Use Miralax every morning, 1 capful with 8 ounces of water or juice.  Use a Fiber supplement (Benefiber, Metamucil, etc)   Drink 6-8, 8 ounce glasses of water.  Sitz baths for 15-20 minutes three time a day.  Recticare with lidocaine as needed.  Follow up pending the results of your Colonoscopy and Endoscopy.

## 2020-04-26 ENCOUNTER — Encounter: Payer: Self-pay | Admitting: Gastroenterology

## 2020-05-03 ENCOUNTER — Other Ambulatory Visit: Payer: Self-pay

## 2020-05-03 ENCOUNTER — Ambulatory Visit (AMBULATORY_SURGERY_CENTER): Payer: PRIVATE HEALTH INSURANCE | Admitting: Gastroenterology

## 2020-05-03 ENCOUNTER — Encounter: Payer: Self-pay | Admitting: Gastroenterology

## 2020-05-03 VITALS — BP 136/74 | HR 75 | Temp 97.8°F | Resp 19 | Ht 59.0 in | Wt 193.0 lb

## 2020-05-03 DIAGNOSIS — K449 Diaphragmatic hernia without obstruction or gangrene: Secondary | ICD-10-CM

## 2020-05-03 DIAGNOSIS — K219 Gastro-esophageal reflux disease without esophagitis: Secondary | ICD-10-CM

## 2020-05-03 DIAGNOSIS — R194 Change in bowel habit: Secondary | ICD-10-CM

## 2020-05-03 DIAGNOSIS — D125 Benign neoplasm of sigmoid colon: Secondary | ICD-10-CM

## 2020-05-03 DIAGNOSIS — R1310 Dysphagia, unspecified: Secondary | ICD-10-CM

## 2020-05-03 DIAGNOSIS — R1013 Epigastric pain: Secondary | ICD-10-CM

## 2020-05-03 DIAGNOSIS — K222 Esophageal obstruction: Secondary | ICD-10-CM | POA: Diagnosis not present

## 2020-05-03 DIAGNOSIS — K648 Other hemorrhoids: Secondary | ICD-10-CM

## 2020-05-03 DIAGNOSIS — K625 Hemorrhage of anus and rectum: Secondary | ICD-10-CM

## 2020-05-03 MED ORDER — SODIUM CHLORIDE 0.9 % IV SOLN: 500.0000 mL | Freq: Once | INTRAVENOUS | Status: DC

## 2020-05-03 NOTE — Patient Instructions (Signed)
Handouts on hiatal hernia, and polyps given to you today  Await  Pathology results Daily fiber supplement recommended  Talk to primary care provider about having a sleep study    [place]:   Refer to the procedure report that was given to you for any specific questions about what was found during the examination.  If the procedure report does not answer your questions, please call your gastroenterologist to clarify.  If you requested that your care partner not be given the details of your procedure findings, then the procedure report has been included in a sealed envelope for you to review at your convenience later.  YOU SHOULD EXPECT: Some feelings of bloating in the abdomen. Passage of more gas than usual.  Walking can help get rid of the air that was put into your GI tract during the procedure and reduce the bloating. If you had a lower endoscopy (such as a colonoscopy or flexible sigmoidoscopy) you may notice spotting of blood in your stool or on the toilet paper. If you underwent a bowel prep for your procedure, you may not have a normal bowel movement for a few days.  Please Note:  You might notice some irritation and congestion in your nose or some drainage.  This is from the oxygen used during your procedure.  There is no need for concern and it should clear up in a day or so.  SYMPTOMS TO REPORT IMMEDIATELY:   Following lower endoscopy (colonoscopy or flexible sigmoidoscopy):  Excessive amounts of blood in the stool  Significant tenderness or worsening of abdominal pains  Swelling of the abdomen that is new, acute  Fever of 100F or higher   Following upper endoscopy (EGD)  Vomiting of blood or coffee ground material  New chest pain or pain under the shoulder blades  Painful or persistently difficult swallowing  New shortness of breath  Fever of 100F or higher  Black, tarry-looking stools  For urgent or emergent  issues, a gastroenterologist can be reached at any hour by calling [phone]. Do not use MyChart messaging for urgent concerns.    DIET:  We do recommend a small meal at first, but then you may proceed to your regular diet.  Drink plenty of fluids but you should avoid alcoholic beverages for 24 hours.  ACTIVITY:  You should plan to take it easy for the rest of today and you should NOT DRIVE or use heavy machinery until tomorrow (because of the sedation medicines used during the test).    FOLLOW UP: Our staff will call the number listed on your records 48-72 hours following your procedure to check on you and address any questions or concerns that you may have regarding the information given to you following your procedure. If we do not reach you, we will leave a message.  We will attempt to reach you two times.  During this call, we will ask if you have developed any symptoms of COVID 19. If you develop any symptoms (ie: fever, flu-like symptoms, shortness of breath, cough etc.) before then, please call [phone].  If you test positive for Covid 19 in the 2 weeks post procedure, please call and report this information to [place].    If any biopsies were taken you will be contacted by phone or by letter within the next 1-3 weeks.  Please call us at [phone] if you have not heard about the biopsies in 3 weeks.  SIGNATURES/CONFIDENTIALITY: You and/or your care partner have signed paperwork which will be entered into your electronic medical record.  These signatures attest to the fact that that the information above on your After Visit Summary has been reviewed and is understood.  Full responsibility of the confidentiality of this discharge information lies with you and/or your care-partner.

## 2020-05-03 NOTE — Progress Notes (Signed)
pt tolerated well. VSS. awake and to recovery. Report given to RN. Bite block placed and removed with ease. NT removed without trauma.

## 2020-05-03 NOTE — Op Note (Signed)
[facility] Patient Name: Cheyenne Larsen Procedure Date: 05/03/2020 10:54 AM MRN: [id number] Endoscopist: Remo Lipps P. Havery Moros , MD Age: 49 Referring MD:  Date of Birth: [birth year] Gender: Female Account #: [id number] Procedure:                Upper GI endoscopy Indications:              Epigastric abdominal pain, Dysphagia, history of                            gastro-esophageal reflux disease which is                            controlled with omeprazole 40mg  once daily Medicines:                Monitored Anesthesia Care Procedure:                Pre-Anesthesia Assessment:                           - Prior to the procedure, a History and Physical                            was performed, and patient medications and                            allergies were reviewed. The patient's tolerance of                            previous anesthesia was also reviewed. The risks                            and benefits of the procedure and the sedation                            options and risks were discussed with the patient.                            All questions were answered, and informed consent                            was obtained. Prior Anticoagulants: The patient has                            taken no previous anticoagulant or antiplatelet                            agents. ASA Grade Assessment: III - A patient with                            severe systemic disease. After reviewing the risks                            and benefits, the patient was deemed in  satisfactory condition to undergo the procedure.                           After obtaining informed consent, the endoscope was                            passed under direct vision. Throughout the                            procedure, the patient's blood pressure, pulse, and                            oxygen saturations were monitored continuously. The                             Endoscope was introduced through the mouth, and                            advanced to the second part of duodenum. The upper                            GI endoscopy was accomplished without difficulty.                            The patient tolerated the procedure. Scope In: Scope Out: Findings:                 Esophagogastric landmarks were identified: the                            Z-line was found at 35 cm, the gastroesophageal                            junction was found at 35 cm and the upper extent of                            the gastric folds was found at 38 cm from the                            incisors.                           A 3 cm hiatal hernia was present.                           One benign-appearing, intrinsic mild stenosis was                            found 35 cm from the incisors. This stenosis                            measured less than one cm (in length). A TTS  dilator was passed through the scope. Dilation with                            an 18-19-20 mm balloon dilator was performed to 18                            mm, 19 mm and 20 mm with minimal mucosal                            disruption. Biopsy forceps was used to open the                            stricture further.                           The exam of the esophagus was otherwise normal.                           The entire examined stomach was normal. Biopsies                            were taken with a cold forceps for Helicobacter                            pylori testing.                           The duodenal bulb and second portion of the                            duodenum were normal. Complications:            patient had transient oxygen desaturation during                            the procedure, nasal trumpet placed with good                            response per anesthesia. Given breathing appearance                            observed with anesthesia,  concern for underlying OSA Estimated Blood Loss:     Estimated blood loss was minimal. Impression:               - Esophagogastric landmarks identified.                           - 3 cm hiatal hernia.                           - Benign-appearing esophageal stenosis. Dilated to                            32mm as described.                           -  Normal stomach. Biopsied.                           - Normal duodenal bulb and second portion of the                            duodenum. Recommendation:           - Patient has a contact number available for                            emergencies. The signs and symptoms of potential                            delayed complications were discussed with the                            patient. Return to normal activities tomorrow.                            Written discharge instructions were provided to the                            patient.                           - Resume previous diet.                           - Continue present medications.                           - Await pathology results.                           - Recommend sleep study to rule out obstructive                            sleep apnea, should discuss with primary care                            physician Carlota Raspberry. Iasia Forcier, MD 05/03/2020 11:49:04 AM This report has been signed electronically.

## 2020-05-03 NOTE — Progress Notes (Signed)
Called to room to assist during endoscopic procedure.  Patient ID and intended procedure confirmed with present staff. Received instructions for my participation in the procedure from the performing physician.  

## 2020-05-03 NOTE — Op Note (Signed)
[facility] Patient Name: Cheyenne Larsen Procedure Date: 05/03/2020 10:54 AM MRN: [id number] Endoscopist: Remo Lipps P. Havery Moros , MD Age: 49 Referring MD:  Date of Birth: 05/16/1971 Gender: Female Account #: [id number] Procedure:                Colonoscopy Indications:              This is the patient's first colonoscopy, Rectal                            bleeding, altered bowel habits (alternates                            constipation / loose stools) Medicines:                Monitored Anesthesia Care Procedure:                Pre-Anesthesia Assessment:                           - Prior to the procedure, a History and Physical                            was performed, and patient medications and                            allergies were reviewed. The patient's tolerance of                            previous anesthesia was also reviewed. The risks                            and benefits of the procedure and the sedation                            options and risks were discussed with the patient.                            All questions were answered, and informed consent                            was obtained. Prior Anticoagulants: The patient has                            taken no previous anticoagulant or antiplatelet                            agents. ASA Grade Assessment: III - A patient with                            severe systemic disease. After reviewing the risks                            and benefits, the patient was deemed in  satisfactory condition to undergo the procedure.                           After obtaining informed consent, the colonoscope                            was passed under direct vision. Throughout the                            procedure, the patient's blood pressure, pulse, and                            oxygen saturations were monitored continuously. The                            Colonoscope was  introduced through the anus and                            advanced to the the cecum, identified by                            appendiceal orifice and ileocecal valve. The                            colonoscopy was performed without difficulty. The                            patient tolerated the procedure well. The quality                            of the bowel preparation was good. The ileocecal                            valve, appendiceal orifice, and rectum were                            photographed. Scope In: 11:22:16 AM Scope Out: 11:35:46 AM Scope Withdrawal Time: 0 hours 11 minutes 51 seconds  Total Procedure Duration: 0 hours 13 minutes 30 seconds  Findings:                 An anal fissure was found on perianal exam in the                            anterior midline anal canal, and prolapsing                            internal hemorrhoids.                           A 3 to 4 mm polyp was found in the sigmoid colon.                            The polyp was sessile. The polyp was removed with a  cold snare. Resection and retrieval were complete.                           Internal hemorrhoids were found during retroflexion.                           The exam was otherwise without abnormality. Ileal                            intubation attempted however cecum was angulated,                            ileal intubation not successful. Complications:            No immediate complications. Estimated blood loss:                            Minimal. Estimated Blood Loss:     Estimated blood loss was minimal. Impression:               - Anal fissure found on perianal exam.                           - One 3 to 4 mm polyp in the sigmoid colon, removed                            with a cold snare. Resected and retrieved.                           - Internal hemorrhoids.                           - The examination was otherwise normal.                            Bleeding likely due to anal fissure +/- hemorrhoids Recommendation:           - Patient has a contact number available for                            emergencies. The signs and symptoms of potential                            delayed complications were discussed with the                            patient. Return to normal activities tomorrow.                            Written discharge instructions were provided to the                            patient.                           - Resume previous diet.                           -  Continue present medications.                           - Continue with topical 0.125% nitroglycerin                            applied three times daily                           - Recommend daily fiber supplement such as Citrucel                            to provide regularity                           - Await pathology results.                           - If bleeding symptoms persist despite regimen                            after several weeks, please contact me for                            reassessment Remo Lipps P. Faithlynn Deeley, MD 05/03/2020 11:42:08 AM This report has been signed electronically.

## 2020-05-07 ENCOUNTER — Telehealth: Payer: Self-pay | Admitting: *Deleted

## 2020-05-07 NOTE — Telephone Encounter (Signed)
  Follow up Call-  Call back number 05/03/2020  Post procedure Call Back phone  # [phone]  Permission to leave phone message Yes  Some recent data might be hidden     Patient questions:  Do you have a fever, pain , or abdominal swelling? No. Pain Score  0 *  Have you tolerated food without any problems? Yes.    Have you been able to return to your normal activities? Yes.    Do you have any questions about your discharge instructions: Diet   No. Medications  No. Follow up visit  No.  Do you have questions or concerns about your Care? No.  Actions: * If pain score is 4 or above: 1. No action needed, pain <4.Have you developed a fever since your procedure? no  2.   Have you had an respiratory symptoms (SOB or cough) since your procedure? no  3.   Have you tested positive for COVID 19 since your procedure no  4.   Have you had any family members/close contacts diagnosed with the COVID 19 since your procedure?  no   If yes to any of these questions please route to Joylene John, RN and Joella Prince, RN

## 2020-05-07 NOTE — Telephone Encounter (Signed)
First follow up call made, left message. 

## 2020-05-13 ENCOUNTER — Encounter: Payer: Self-pay | Admitting: *Deleted

## 2020-07-09 ENCOUNTER — Other Ambulatory Visit: Payer: Self-pay | Admitting: Obstetrics & Gynecology

## 2020-07-09 DIAGNOSIS — N644 Mastodynia: Secondary | ICD-10-CM

## 2020-07-24 ENCOUNTER — Ambulatory Visit: Payer: PRIVATE HEALTH INSURANCE

## 2020-07-24 ENCOUNTER — Other Ambulatory Visit: Payer: Self-pay

## 2020-07-24 ENCOUNTER — Ambulatory Visit
Admission: RE | Admit: 2020-07-24 | Discharge: 2020-07-24 | Disposition: A | Discharge status: Home / Self Care | Payer: PRIVATE HEALTH INSURANCE | Source: Ambulatory Visit | Attending: Obstetrics & Gynecology | Admitting: Obstetrics & Gynecology

## 2020-07-24 DIAGNOSIS — N644 Mastodynia: Secondary | ICD-10-CM

## 2020-08-09 ENCOUNTER — Other Ambulatory Visit: Payer: Self-pay

## 2020-08-09 ENCOUNTER — Encounter: Payer: Self-pay | Admitting: Family Medicine

## 2020-08-09 ENCOUNTER — Ambulatory Visit (INDEPENDENT_AMBULATORY_CARE_PROVIDER_SITE_OTHER): Payer: PRIVATE HEALTH INSURANCE | Admitting: Family Medicine

## 2020-08-09 VITALS — BP 118/70 | HR 72 | Temp 97.6°F | Resp 19 | Ht 59.0 in | Wt 197.8 lb

## 2020-08-09 DIAGNOSIS — E669 Obesity, unspecified: Secondary | ICD-10-CM | POA: Diagnosis not present

## 2020-08-09 DIAGNOSIS — E559 Vitamin D deficiency, unspecified: Secondary | ICD-10-CM

## 2020-08-09 DIAGNOSIS — Z0000 Encounter for general adult medical examination without abnormal findings: Secondary | ICD-10-CM

## 2020-08-09 NOTE — Assessment & Plan Note (Signed)
Pt has hx of low Vit D.  Check labs and replete prn. 

## 2020-08-09 NOTE — Patient Instructions (Signed)
Follow up in 1 year or as needed Please schedule a lab visit at your convenience (location of your choice) or go to [facility] for labs (no appt needed)  We'll notify you of your lab results and make any changes if needed Continue to work on healthy diet and regular exercise- you can do it! Call with any questions or concerns Stay Safe!  Stay Healthy! Happy Holidays!!!

## 2020-08-09 NOTE — Assessment & Plan Note (Signed)
Ongoing issue for pt.  Discussed need for healthy diet and regular exercise.  Check labs to risk stratify.  Will follow. 

## 2020-08-09 NOTE — Progress Notes (Signed)
   Subjective:    Patient ID: Cheyenne Larsen, female    DOB: 05-29-1971, 49 y.o.   MRN: [id number]  HPI CPE- UTD on mammo, colonoscopy, Tdap, COVID.  Allergic to flu vaccine.  Patient Care Team    Relationship Specialty Notifications Start End  Midge Minium, MD PCP - General   08/06/10   Sanjuana Kava, MD Referring Physician Obstetrics and Gynecology  07/19/18     Health Maintenance  Topic Date Due  . COVID-19 Vaccine (3 - Booster for [place] series) 08/25/2020 (Originally 07/10/2020)  . HIV Screening  08/09/2021 (Originally 09/14/1984)  . MAMMOGRAM  07/24/2021  . TETANUS/TDAP  08/10/2026  . COLONOSCOPY  05/04/2027  . Hepatitis C Screening  Completed      Review of Systems Patient reports no vision/ hearing changes, adenopathy,fever, weight change,  persistant/recurrent hoarseness , swallowing issues, chest pain, palpitations, edema, persistant/recurrent cough, hemoptysis, dyspnea (rest/exertional/paroxysmal nocturnal), gastrointestinal bleeding (melena, rectal bleeding), abdominal pain, significant heartburn, bowel changes, GU symptoms (dysuria, hematuria, incontinence), Gyn symptoms (abnormal  bleeding, pain),  syncope, focal weakness, memory loss, numbness & tingling, skin/hair/nail changes, abnormal bruising or bleeding, anxiety, or depression.   + neck pain, shoulder pain, marks/indents from bra, breast pain due to heaviness.  Currently wearing cup size H   This visit occurred during the SARS-CoV-2 public health emergency.  Safety protocols were in place, including screening questions prior to the visit, additional usage of staff PPE, and extensive cleaning of exam room while observing appropriate contact time as indicated for disinfecting solutions.       Objective:   Physical Exam General Appearance:    Alert, cooperative, no distress, appears stated age, obese  Head:    Normocephalic, without obvious abnormality, atraumatic  Eyes:    PERRL, conjunctiva/corneas  clear, EOM's intact, fundi    benign, both eyes  Ears:    Normal TM's and external ear canals, both ears  Nose:   Deferred due to COVID  Throat:   Neck:   Supple, symmetrical, trachea midline, no adenopathy;    Thyroid: no enlargement/tenderness/nodules  Back:     Symmetric, no curvature, ROM normal, no CVA tenderness  Lungs:     Clear to auscultation bilaterally, respirations unlabored  Chest Wall:    No tenderness or deformity, bra strap marks clearly seen on both shoulders   Heart:    Regular rate and rhythm, S1 and S2 normal, no murmur, rub   or gallop  Breast Exam:    Deferred to GYN  Abdomen:     Soft, non-tender, bowel sounds active all four quadrants,    no masses, no organomegaly  Genitalia:    Deferred to GYN  Rectal:    Extremities:   Extremities normal, atraumatic, no cyanosis or edema  Pulses:   2+ and symmetric all extremities  Skin:   Skin color, texture, turgor normal, no rashes or lesions  Lymph nodes:   Cervical, supraclavicular, and axillary nodes normal  Neurologic:   CNII-XII intact, normal strength, sensation and reflexes    throughout          Assessment & Plan:

## 2020-08-09 NOTE — Assessment & Plan Note (Signed)
Pt's PE WNL w/ exception of obesity and very large breasts.  UTD on mammo, colonoscopy, immunizations.  Check labs.  Anticipatory guidance provided.

## 2020-08-31 ENCOUNTER — Ambulatory Visit: Payer: PRIVATE HEALTH INSURANCE | Attending: Internal Medicine

## 2020-08-31 DIAGNOSIS — Z23 Encounter for immunization: Secondary | ICD-10-CM

## 2020-08-31 NOTE — Progress Notes (Signed)
   Covid-19 Vaccination Clinic  Name:  Rhyanna Sorce    MRN: [id number] DOB: 23-Nov-1970  08/31/2020  Ms. Nitzia Perren was observed post Covid-19 immunization for 30 minutes based on pre-vaccination screening without incident. She was provided with Vaccine Information Sheet and instruction to access the V-Safe system.   Ms. Ciarra Braddy was instructed to call 911 with any severe reactions post vaccine: Marland Kitchen Difficulty breathing  . Swelling of face and throat  . A fast heartbeat  . A bad rash all over body  . Dizziness and weakness   Immunizations Administered    Name Date Dose VIS Date Route   Pfizer COVID-19 Vaccine 08/31/2020 12:51 PM 0.3 mL 06/12/2020 Intramuscular   Manufacturer: [place]   Lot: [id number]   NDC: [address]-5

## 2020-11-07 HISTORY — PX: EYE SURGERY: SHX253

## 2020-12-05 HISTORY — PX: EYE SURGERY: SHX253

## 2021-01-13 ENCOUNTER — Telehealth: Payer: Self-pay

## 2021-01-13 NOTE — Telephone Encounter (Signed)
FYI  Patient called today requesting appointment with Dr. Birdie Riddle on 5/27 after 12pm to accommodate her work schedule.   I have scheduled patient for 2:30pm.  Patient states she was seen at [facility] on Saturday 5/21.  States she was experiencing  High BP along with headaches.  States by the time she got to UC her BP had went down.  States she was diagnosed with a bladder infection and given a prescription to treat.    Patient states she is not experiencing any symptoms today.    I advised patient to keep track of her BP and to go straight to the ED if she starts to experience high BP or symptoms again.  Patient stated that she would and that she was also advised this by the UC.

## 2021-01-17 ENCOUNTER — Ambulatory Visit (INDEPENDENT_AMBULATORY_CARE_PROVIDER_SITE_OTHER): Payer: PRIVATE HEALTH INSURANCE | Admitting: Family Medicine

## 2021-01-17 ENCOUNTER — Other Ambulatory Visit: Payer: Self-pay

## 2021-01-17 ENCOUNTER — Encounter: Payer: Self-pay | Admitting: Family Medicine

## 2021-01-17 VITALS — BP 125/88 | HR 79 | Temp 97.4°F | Resp 19 | Ht 59.0 in | Wt 205.8 lb

## 2021-01-17 DIAGNOSIS — F419 Anxiety disorder, unspecified: Secondary | ICD-10-CM

## 2021-01-17 DIAGNOSIS — F32A Depression, unspecified: Secondary | ICD-10-CM

## 2021-01-17 DIAGNOSIS — R030 Elevated blood-pressure reading, without diagnosis of hypertension: Secondary | ICD-10-CM

## 2021-01-17 MED ORDER — VENLAFAXINE HCL ER 37.5 MG PO CP24: 37.5000 mg | ORAL_CAPSULE | Freq: Every day | ORAL | 3 refills | Status: DC

## 2021-01-17 NOTE — Progress Notes (Signed)
   Subjective:    Patient ID: Cheyenne Larsen, female    DOB: 06-27-1971, 50 y.o.   MRN: [id number]  HPI Elevated BP- Daughter checked her BP Saturday afternoon and it was as high as 160/102.  She checked BP b/c she had a HA x4 days- frontal surrounding R eye.  Later that day went to [facility] for UTI and BP was 114/74.  Home BP on Monday was 142/93.  Last visit here was 118/70.  Home BPs this week have been 119/78 (Tuesday) and 137/80 (Thursday).  HA has since resolved.  No SOB, visual changes, edema.  2 days ago had short lived sharp chest pain.  Anxiety/Depression- pt reports daughter encouraged her to try medication.  Lots of work stress.  Pt is also having hot flashes.  Pt has never been on medication before  Obesity- pt has gained 8 lbs since last visit.  BMI is now 41.57   Review of Systems For ROS see HPI   This visit occurred during the SARS-CoV-2 public health emergency.  Safety protocols were in place, including screening questions prior to the visit, additional usage of staff PPE, and extensive cleaning of exam room while observing appropriate contact time as indicated for disinfecting solutions.       Objective:   Physical Exam Vitals reviewed.  Constitutional:      General: She is not in acute distress.    Appearance: Normal appearance. She is well-developed. She is obese.  HENT:     Head: Normocephalic and atraumatic.  Eyes:     Conjunctiva/sclera: Conjunctivae normal.     Pupils: Pupils are equal, round, and reactive to light.  Neck:     Thyroid: No thyromegaly.  Cardiovascular:     Rate and Rhythm: Normal rate and regular rhythm.     Pulses: Normal pulses.     Heart sounds: Normal heart sounds. No murmur heard.   Pulmonary:     Effort: Pulmonary effort is normal. No respiratory distress.     Breath sounds: Normal breath sounds.  Abdominal:     General: There is no distension.     Palpations: Abdomen is soft.     Tenderness: There is no abdominal  tenderness.  Musculoskeletal:     Cervical back: Normal range of motion and neck supple.     Right lower leg: No edema.     Left lower leg: No edema.  Lymphadenopathy:     Cervical: No cervical adenopathy.  Skin:    General: Skin is warm and dry.  Neurological:     General: No focal deficit present.     Mental Status: She is alert and oriented to person, place, and time.  Psychiatric:        Behavior: Behavior normal.     Comments: Visibly stressed           Assessment & Plan:  Elevated BP-

## 2021-01-17 NOTE — Patient Instructions (Signed)
Follow up in 4-6 weeks to recheck BP and mood START the Venlafaxine once daily in the morning to help w/ mood and hot flashes I suspect the pain of the headache caused the elevated BP (stress can also cause high BP) Continue to work on healthy diet, regular exercise, and lots of water! Call with any questions or concerns Enjoy your weekend! Hang in there!!!

## 2021-01-17 NOTE — Assessment & Plan Note (Signed)
New.  Pt has gained 8 lbs since last visit.  BMI is now 41.57.  Stressed need for healthy diet and regular exercise for both weight loss and stress management.  Will continue to follow.

## 2021-01-17 NOTE — Assessment & Plan Note (Signed)
New.  Pt is under considerable stress at work.  To the point her daughter has encouraged her to start medication.  She is also having hot flashes and other menopausal sxs so we will start Venlafaxine 37.5mg  daily to improve both mood and hot flashes.  Pt expressed understanding and is in agreement w/ plan.

## 2021-01-17 NOTE — Assessment & Plan Note (Signed)
new.  Suspect this was due to ongoing headache and increased stress levels.  BP has previously been well controlled.  She is asymptomatic at this time and BP is close to normal range.  Will hold on medication at this time but will follow closely.  Pt expressed understanding and is in agreement w/ plan.

## 2021-02-17 ENCOUNTER — Encounter: Payer: Self-pay | Admitting: Family Medicine

## 2021-02-17 ENCOUNTER — Other Ambulatory Visit: Payer: Self-pay

## 2021-02-17 ENCOUNTER — Ambulatory Visit (INDEPENDENT_AMBULATORY_CARE_PROVIDER_SITE_OTHER): Payer: PRIVATE HEALTH INSURANCE | Admitting: Family Medicine

## 2021-02-17 VITALS — BP 115/80 | HR 82 | Temp 97.7°F | Resp 20 | Ht 59.0 in | Wt 199.6 lb

## 2021-02-17 DIAGNOSIS — M5410 Radiculopathy, site unspecified: Secondary | ICD-10-CM

## 2021-02-17 DIAGNOSIS — R030 Elevated blood-pressure reading, without diagnosis of hypertension: Secondary | ICD-10-CM | POA: Diagnosis not present

## 2021-02-17 DIAGNOSIS — F419 Anxiety disorder, unspecified: Secondary | ICD-10-CM

## 2021-02-17 DIAGNOSIS — F32A Depression, unspecified: Secondary | ICD-10-CM

## 2021-02-17 LAB — VITAMIN D 25 HYDROXY (VIT D DEFICIENCY, FRACTURES): VITD: 38.74 ng/mL (ref 30.00–100.00)

## 2021-02-17 LAB — BASIC METABOLIC PANEL
BUN: 11 mg/dL (ref 6–23)
CO2: 23 mEq/L (ref 19–32)
Calcium: 8.8 mg/dL (ref 8.4–10.5)
Chloride: 105 mEq/L (ref 96–112)
Creatinine, Ser: 0.94 mg/dL (ref 0.40–1.20)
GFR: 70.3 mL/min (ref >60.00)
Glucose, Bld: 86 mg/dL (ref 70–99)
Potassium: 4.3 mEq/L (ref 3.5–5.1)
Sodium: 137 mEq/L (ref 135–145)

## 2021-02-17 LAB — CBC WITH DIFFERENTIAL/PLATELET
Basophils Absolute: 0.1 10*3/uL (ref 0.0–0.1)
Basophils Relative: 1.1 % (ref 0.0–3.0)
Eosinophils Absolute: 0.2 10*3/uL (ref 0.0–0.7)
Eosinophils Relative: 3 % (ref 0.0–5.0)
HCT: 39 % (ref 36.0–46.0)
Hemoglobin: 12.8 g/dL (ref 12.0–15.0)
Lymphocytes Relative: 31.6 % (ref 12.0–46.0)
Lymphs Abs: 2 10*3/uL (ref 0.7–4.0)
MCHC: 32.8 g/dL (ref 30.0–36.0)
MCV: 82.6 fl (ref 78.0–100.0)
Monocytes Absolute: 0.5 10*3/uL (ref 0.1–1.0)
Monocytes Relative: 7.3 % (ref 3.0–12.0)
Neutro Abs: 3.6 10*3/uL (ref 1.4–7.7)
Neutrophils Relative %: 57 % (ref 43.0–77.0)
Platelets: 289 10*3/uL (ref 150.0–400.0)
RBC: 4.72 Mil/uL (ref 3.87–5.11)
RDW: 14.4 % (ref 11.5–15.5)
WBC: 6.2 10*3/uL (ref 4.0–10.5)

## 2021-02-17 LAB — HEPATIC FUNCTION PANEL
ALT: 16 U/L (ref 0–35)
AST: 13 U/L (ref 0–37)
Albumin: 4.1 g/dL (ref 3.5–5.2)
Alkaline Phosphatase: 72 U/L (ref 39–117)
Bilirubin, Direct: 0 mg/dL (ref 0.0–0.3)
Total Bilirubin: 0.3 mg/dL (ref 0.2–1.2)
Total Protein: 6.4 g/dL (ref 6.0–8.3)

## 2021-02-17 LAB — TSH: TSH: 1.07 u[IU]/mL (ref 0.35–4.50)

## 2021-02-17 LAB — LIPID PANEL
Cholesterol: 175 mg/dL (ref 0–200)
HDL: 41.6 mg/dL (ref >39.00)
LDL Cholesterol: 113 mg/dL — ABNORMAL HIGH (ref 0–99)
NonHDL: 133.02
Total CHOL/HDL Ratio: 4
Triglycerides: 98 mg/dL (ref 0.0–149.0)
VLDL: 19.6 mg/dL (ref 0.0–40.0)

## 2021-02-17 MED ORDER — PREDNISONE 10 MG PO TABS: ORAL_TABLET | ORAL | 0 refills | Status: DC

## 2021-02-17 MED ORDER — METHOCARBAMOL 500 MG PO TABS: 500.0000 mg | ORAL_TABLET | Freq: Three times a day (TID) | ORAL | 0 refills | Status: DC | PRN

## 2021-02-17 NOTE — Assessment & Plan Note (Signed)
Pt was started on medication at last visit and reports mood has improved.  She and daughter have both noticed improvement.  Pt is not interested in med changes at this time.  Will continue the Effexor at 37.5mg  daily

## 2021-02-17 NOTE — Patient Instructions (Addendum)
Schedule your complete physical for December We'll notify you of your lab results and make any changes if needed Continue to work on healthy diet and regular exercise- you're doing great!!! START the Prednisone as directed- take w/ food- to help w/ the back pain USE the Methocarbamol as needed for spasm- great before bed and only if not working or driving during the day Call with any questions or concerns Have a great summer!!!

## 2021-02-17 NOTE — Assessment & Plan Note (Signed)
Pt is down 7 lbs since last visit.  She feels Effexor has decreased her appetite and she is pleased w/ this.  Encouraged her to continue healthy diet and regula exercise.  Check labs to risk stratify.  Will follow.

## 2021-02-17 NOTE — Assessment & Plan Note (Signed)
BP has normalized today.  Suspect BP was high partly due to stress level.  Currently asymptomatic.  Will follow

## 2021-02-17 NOTE — Progress Notes (Signed)
   Subjective:    Patient ID: Cheyenne Larsen, female    DOB: 12/02/1970, 50 y.o.   MRN: [id number]  HPI Anxiety/depression- pt was started on Venlafaxine 37.5mg  daily at last visit.  Pt reports mood is 'really good'.  Pt reports she is 'a lot calmer'.  Daughter has noticed increased energy and motivation to do things.  Elevated BP- today's BP is normal at 115/80.  Denies CP, SOB, visual changes, edema.  Obesity- just since 5/27 pt is down 7 lbs.  BMI 40.31.  Pt reports 'appetite has changed'.  Isn't sure if this is medication related.  She is eating healthy foods- fruits, yogurt, water- and now walking regularly.  R sided LBP- started yesterday.  Had trouble getting out of bed yesterday.  Used BioFreeze and Lidocaine patch.  Pain is now shooting down R leg.  No known injury- no pushing/pulling/lifting.  No bowel or bladder incontinence.  Mild numbness of R leg.   Review of Systems For ROS see HPI   This visit occurred during the SARS-CoV-2 public health emergency.  Safety protocols were in place, including screening questions prior to the visit, additional usage of staff PPE, and extensive cleaning of exam room while observing appropriate contact time as indicated for disinfecting solutions.      Objective:   Physical Exam Vitals reviewed.  Constitutional:      General: She is not in acute distress.    Appearance: Normal appearance. She is well-developed. She is obese. She is not ill-appearing.  HENT:     Head: Normocephalic and atraumatic.  Eyes:     Conjunctiva/sclera: Conjunctivae normal.     Pupils: Pupils are equal, round, and reactive to light.  Neck:     Thyroid: No thyromegaly.  Cardiovascular:     Rate and Rhythm: Normal rate and regular rhythm.     Pulses: Normal pulses.     Heart sounds: Normal heart sounds. No murmur heard. Pulmonary:     Effort: Pulmonary effort is normal. No respiratory distress.     Breath sounds: Normal breath sounds.  Abdominal:      General: There is no distension.     Palpations: Abdomen is soft.     Tenderness: There is no abdominal tenderness.  Musculoskeletal:     Cervical back: Normal range of motion and neck supple.     Right lower leg: No edema.     Left lower leg: No edema.  Lymphadenopathy:     Cervical: No cervical adenopathy.  Skin:    General: Skin is warm and dry.  Neurological:     Mental Status: She is alert and oriented to person, place, and time.     Comments: + SLR on R, (-) SLR on L  Psychiatric:        Behavior: Behavior normal.          Assessment & Plan:   Radicular back pain- new.  Pt w/ + SLR on R.  No known injury.  Start Prednisone taper and Methocarbamol for symptom relief.  Pt expressed understanding and is in agreement w/ plan.

## 2021-02-19 ENCOUNTER — Encounter: Payer: Self-pay | Admitting: *Deleted

## 2021-03-15 ENCOUNTER — Other Ambulatory Visit: Payer: Self-pay | Admitting: Family Medicine

## 2021-03-18 ENCOUNTER — Telehealth: Payer: Self-pay | Admitting: Family Medicine

## 2021-03-18 NOTE — Telephone Encounter (Signed)
Pharmacy comment: Alternative Requested:90 DAY PRESCRIPTION NEEDED PER INSURANCE

## 2021-03-18 NOTE — Telephone Encounter (Signed)
Patient need VEN - a 90 day supply - please send to [facility] on [address], [place]

## 2021-03-18 NOTE — Telephone Encounter (Signed)
Called and spoke to patient about concerns. Rx for Effexor was sent in to patient pharmacy for 30 day supply with 3 refills by [facility]. Patient understood. No further concerns at this time.

## 2021-03-19 ENCOUNTER — Telehealth: Payer: Self-pay

## 2021-03-19 ENCOUNTER — Other Ambulatory Visit: Payer: Self-pay

## 2021-03-19 MED ORDER — VENLAFAXINE HCL ER 37.5 MG PO CP24
37.5000 mg | ORAL_CAPSULE | Freq: Every day | ORAL | 0 refills | Status: DC
Start: 2021-03-19 — End: 2021-06-17

## 2021-03-19 NOTE — Telephone Encounter (Signed)
Pt requested a 90 days supply of venlafaxine XR (EFFEXOR-XR) 37.5 MG 24 hr capsule  to [facility] to  [id number] [place] [address]   In medication chart electronic script failed to send can this be resent   Pt call back [phone]

## 2021-03-19 NOTE — Telephone Encounter (Signed)
Medication resent to pharmacy for a 90 day supply.

## 2021-06-17 ENCOUNTER — Other Ambulatory Visit: Payer: Self-pay | Admitting: Family Medicine

## 2021-09-18 ENCOUNTER — Other Ambulatory Visit: Payer: Self-pay | Admitting: Family Medicine

## 2021-09-19 ENCOUNTER — Ambulatory Visit (INDEPENDENT_AMBULATORY_CARE_PROVIDER_SITE_OTHER): Payer: Managed Care, Other (non HMO) | Admitting: Family Medicine

## 2021-09-19 ENCOUNTER — Encounter: Payer: Self-pay | Admitting: Family Medicine

## 2021-09-19 VITALS — BP 128/84 | HR 79 | Temp 98.0°F | Resp 16 | Ht 59.5 in | Wt 196.4 lb

## 2021-09-19 DIAGNOSIS — E669 Obesity, unspecified: Secondary | ICD-10-CM | POA: Diagnosis not present

## 2021-09-19 DIAGNOSIS — Z0000 Encounter for general adult medical examination without abnormal findings: Secondary | ICD-10-CM

## 2021-09-19 DIAGNOSIS — E559 Vitamin D deficiency, unspecified: Secondary | ICD-10-CM | POA: Diagnosis not present

## 2021-09-19 DIAGNOSIS — M25561 Pain in right knee: Secondary | ICD-10-CM

## 2021-09-19 LAB — LIPID PANEL
Cholesterol: 186 mg/dL (ref 0–200)
HDL: 44.3 mg/dL (ref >39.00)
LDL Cholesterol: 123 mg/dL — ABNORMAL HIGH (ref 0–99)
NonHDL: 141.78
Total CHOL/HDL Ratio: 4
Triglycerides: 92 mg/dL (ref 0.0–149.0)
VLDL: 18.4 mg/dL (ref 0.0–40.0)

## 2021-09-19 LAB — HEPATIC FUNCTION PANEL
ALT: 17 U/L (ref 0–35)
AST: 15 U/L (ref 0–37)
Albumin: 4.2 g/dL (ref 3.5–5.2)
Alkaline Phosphatase: 78 U/L (ref 39–117)
Bilirubin, Direct: 0.1 mg/dL (ref 0.0–0.3)
Total Bilirubin: 0.4 mg/dL (ref 0.2–1.2)
Total Protein: 7 g/dL (ref 6.0–8.3)

## 2021-09-19 LAB — CBC WITH DIFFERENTIAL/PLATELET
Basophils Absolute: 0.1 10*3/uL (ref 0.0–0.1)
Basophils Relative: 0.8 % (ref 0.0–3.0)
Eosinophils Absolute: 0.2 10*3/uL (ref 0.0–0.7)
Eosinophils Relative: 3.4 % (ref 0.0–5.0)
HCT: 40.6 % (ref 36.0–46.0)
Hemoglobin: 13 g/dL (ref 12.0–15.0)
Lymphocytes Relative: 29.7 % (ref 12.0–46.0)
Lymphs Abs: 2 10*3/uL (ref 0.7–4.0)
MCHC: 31.9 g/dL (ref 30.0–36.0)
MCV: 81.5 fl (ref 78.0–100.0)
Monocytes Absolute: 0.3 10*3/uL (ref 0.1–1.0)
Monocytes Relative: 5.2 % (ref 3.0–12.0)
Neutro Abs: 4.1 10*3/uL (ref 1.4–7.7)
Neutrophils Relative %: 60.9 % (ref 43.0–77.0)
Platelets: 330 10*3/uL (ref 150.0–400.0)
RBC: 4.99 Mil/uL (ref 3.87–5.11)
RDW: 14.8 % (ref 11.5–15.5)
WBC: 6.7 10*3/uL (ref 4.0–10.5)

## 2021-09-19 LAB — BASIC METABOLIC PANEL
BUN: 14 mg/dL (ref 6–23)
CO2: 25 mEq/L (ref 19–32)
Calcium: 9.4 mg/dL (ref 8.4–10.5)
Chloride: 105 mEq/L (ref 96–112)
Creatinine, Ser: 0.78 mg/dL (ref 0.40–1.20)
GFR: 87.58 mL/min (ref >60.00)
Glucose, Bld: 95 mg/dL (ref 70–99)
Potassium: 4.3 mEq/L (ref 3.5–5.1)
Sodium: 140 mEq/L (ref 135–145)

## 2021-09-19 LAB — TSH: TSH: 1.74 u[IU]/mL (ref 0.35–5.50)

## 2021-09-19 LAB — VITAMIN D 25 HYDROXY (VIT D DEFICIENCY, FRACTURES): VITD: 44.31 ng/mL (ref 30.00–100.00)

## 2021-09-19 NOTE — Assessment & Plan Note (Signed)
Pt is down 5 lbs since last visit.  This has taken her out of the morbidly obese range.  BMI now 39.  Check labs to risk stratify.  Will follow.

## 2021-09-19 NOTE — Assessment & Plan Note (Signed)
Check labs and replete prn. 

## 2021-09-19 NOTE — Assessment & Plan Note (Signed)
Pt's PE WNL w/ exception of obesity.  UTD on colonoscopy.  Tdap.  Allergic to flu shots.  Mammo scheduled for next month.  Check labs.  Anticipatory guidance provided.

## 2021-09-19 NOTE — Progress Notes (Signed)
° °  Subjective:    Patient ID: Cheyenne Larsen, female    DOB: 11-Apr-1971, 51 y.o.   MRN: [id number]  HPI CPE- UTD on colonoscopy, mammo scheduled for next month.  No need for pap due to TAH.  UTD on Tdap.  Patient Care Team    Relationship Specialty Notifications Start End  Midge Minium, MD PCP - General   08/06/10   Sanjuana Kava, MD Referring Physician Obstetrics and Gynecology  07/19/18      Health Maintenance  Topic Date Due   HIV Screening  Never done   Zoster Vaccines- Shingrix (1 of 2) Never done   COVID-19 Vaccine (4 - Booster for Pfizer series) 10/26/2020   MAMMOGRAM  07/24/2021   TETANUS/TDAP  08/10/2026   COLONOSCOPY (Pts 45-60yrs Insurance coverage will need to be confirmed)  05/04/2027   Hepatitis C Screening  Completed   HPV VACCINES  Aged Out      Review of Systems Patient reports no vision/ hearing changes, adenopathy,fever, weight change,  persistant/recurrent hoarseness , swallowing issues, chest pain, palpitations, edema, persistant/recurrent cough, hemoptysis, dyspnea (rest/exertional/paroxysmal nocturnal), gastrointestinal bleeding (melena, rectal bleeding), abdominal pain, significant heartburn, bowel changes, GU symptoms (dysuria, hematuria, incontinence), Gyn symptoms (abnormal  bleeding, pain),  syncope, focal weakness, memory loss, numbness & tingling, skin/hair/nail changes, abnormal bruising or bleeding, anxiety, or depression.   + R knee pain- pt reports she has had pain for last few months.  Has been using heat and ibuprofen.  Pain will keep her up at night.  This visit occurred during the SARS-CoV-2 public health emergency.  Safety protocols were in place, including screening questions prior to the visit, additional usage of staff PPE, and extensive cleaning of exam room while observing appropriate contact time as indicated for disinfecting solutions.      Objective:   Physical Exam General Appearance:    Alert, cooperative, no distress,  appears stated age, obese  Head:    Normocephalic, without obvious abnormality, atraumatic  Eyes:    PERRL, conjunctiva/corneas clear, EOM's intact, fundi    benign, both eyes  Ears:    Normal TM's and external ear canals, both ears  Nose:   Deferred due to COVID  Throat:   Neck:   Supple, symmetrical, trachea midline, no adenopathy;    Thyroid: no enlargement/tenderness/nodules  Back:     Symmetric, no curvature, ROM normal, no CVA tenderness  Lungs:     Clear to auscultation bilaterally, respirations unlabored  Chest Wall:    No tenderness or deformity   Heart:    Regular rate and rhythm, S1 and S2 normal, no murmur, rub   or gallop  Breast Exam:    Deferred to mammo  Abdomen:     Soft, non-tender, bowel sounds active all four quadrants,    no masses, no organomegaly  Genitalia:    Deferred to GYN  Rectal:    Extremities:   Extremities normal, atraumatic, no cyanosis or edema  Pulses:   2+ and symmetric all extremities  Skin:   Skin color, texture, turgor normal, no rashes or lesions  Lymph nodes:   Cervical, supraclavicular, and axillary nodes normal  Neurologic:   CNII-XII intact, normal strength, sensation and reflexes    throughout          Assessment & Plan:

## 2021-09-19 NOTE — Patient Instructions (Addendum)
Follow up in 1 year or as needed We'll notify you of your lab results and make any changes if needed Continue to work on healthy diet and regular exercise- you're doing great! We'll call you with your Orthopedic appt for the knee Have them send me a copy of your mammogram next month Call with any questions or concerns Stay Safe!  Stay Healthy! Happy Belated Birthday!!!

## 2021-09-30 ENCOUNTER — Ambulatory Visit (INDEPENDENT_AMBULATORY_CARE_PROVIDER_SITE_OTHER): Payer: Managed Care, Other (non HMO) | Admitting: Orthopaedic Surgery

## 2021-09-30 ENCOUNTER — Encounter: Payer: Self-pay | Admitting: Physician Assistant

## 2021-09-30 ENCOUNTER — Other Ambulatory Visit: Payer: Self-pay

## 2021-09-30 ENCOUNTER — Ambulatory Visit (INDEPENDENT_AMBULATORY_CARE_PROVIDER_SITE_OTHER): Payer: Managed Care, Other (non HMO)

## 2021-09-30 DIAGNOSIS — M1711 Unilateral primary osteoarthritis, right knee: Secondary | ICD-10-CM | POA: Diagnosis not present

## 2021-09-30 MED ORDER — DICLOFENAC SODIUM 75 MG PO TBEC: 75.0000 mg | DELAYED_RELEASE_TABLET | Freq: Two times a day (BID) | ORAL | 2 refills | Status: DC | PRN

## 2021-09-30 NOTE — Progress Notes (Signed)
Office Visit Note   Patient: Cheyenne Larsen           Date of Birth: Jan 08, 1971           MRN: [id number] Visit Date: 09/30/2021              Requested by: Midge Minium, MD [address] [place],  [place] [address] PCP: Midge Minium, MD   Assessment & Plan: Visit Diagnoses:  1. Primary osteoarthritis of right knee     Plan: Impression is right knee arthritis flareup.  Today, we discussed various Treatment options to include NSAIDs, steroid injection versus viscosupplementation injection.  She would like to try cortisone injection today.  I will also call in an NSAID to take as needed.  I provided her with a viscosupplementation injection handout.  Follow-up with [place] as needed.  Follow-Up Instructions: Return if symptoms worsen or fail to improve.   Orders:  Orders Placed This Encounter  Procedures   Large Joint Inj: R knee   XR KNEE 3 VIEW RIGHT   Meds ordered this encounter  Medications   diclofenac (VOLTAREN) 75 MG EC tablet    Sig: Take 1 tablet (75 mg total) by mouth 2 (two) times daily as needed.    Dispense:  60 tablet    Refill:  2      Procedures: Large Joint Inj: R knee on 09/30/2021 10:01 AM Indications: pain Details: 22 G needle, anterolateral approach Medications: 2 mL lidocaine 1 %; 2 mL bupivacaine 0.25 %; 40 mg methylPREDNISolone acetate 40 MG/ML     Clinical Data: No additional findings.   Subjective: Chief Complaint  Patient presents with   Right Knee - Pain    HPI patient is a pleasant 51 year old female who comes in today with right knee pain for the past 2 to 3 months.  She denies any injury or change in activity.  The pain is intermittent but is aggravated with stair climbing and walking.  She also has pain occasionally at night when she lies down to go to sleep.  She does note intermittent swelling.  No fevers or chills.  She has been taking occasional Aleve which does provide some relief.  No previous cortisone  injection.  Review of Systems as detailed in HPI.  All others reviewed and are negative.   Objective: Vital Signs: LMP 12/31/2011   Physical Exam well-developed well-nourished female no acute distress.  Alert and oriented x3.  Ortho Exam right knee exam shows trace effusion.  Range of motion 0 to 110 degrees.  Medial and lateral joint line tenderness.  Moderate patellofemoral crepitus.  Ligaments are stable.  She is neurovascular tact distally.  Specialty Comments:  No specialty comments available.  Imaging: XR KNEE 3 VIEW RIGHT  Result Date: 10/01/2021 X-rays demonstrate moderate degenerative changes medial compartment mild degenerative changes patellofemoral compartment    PMFS History: Patient Active Problem List   Diagnosis Date Noted   Elevated BP without diagnosis of hypertension 01/17/2021   Anxiety and depression 01/17/2021   Vitamin D deficiency 07/19/2018   Status post abdominal hysterectomy 03/31/2016   Esophageal dysphagia 02/19/2016   Obesity (BMI 30-39.9) 11/21/2014   Hemorrhoids 01/08/2012   Family history of blood clots 01/08/2012   Routine general medical examination at a health care facility 01/08/2012   GERD (gastroesophageal reflux disease) 01/08/2012   THYROMEGALY 10/17/2010   Past Medical History:  Diagnosis Date   Bilateral leg edema    Fatty liver  GERD (gastroesophageal reflux disease)    Glaucoma    Headache    Migraines   Heavy menses     Family History  Problem Relation Age of Onset   Hypertension Mother    Hypertension Father    Diabetes Father        type 2   Glaucoma Father    Heart disease Sister    Heart attack Maternal Grandmother    Bone cancer Maternal Grandfather    Ovarian cancer Paternal Grandmother    Alzheimer's disease Paternal Grandfather    Diabetes Brother    Esophageal cancer Neg Hx    Colon cancer Neg Hx    Rectal cancer Neg Hx    Stomach cancer Neg Hx     Past Surgical History:  Procedure Laterality Date    ABDOMINAL HYSTERECTOMY N/A 03/31/2016   Procedure: HYSTERECTOMY ABDOMINAL;  Surgeon: Sanjuana Kava, MD;  Location: [facility];  Service: Gynecology;  Laterality: N/A;   CESAREAN SECTION     CYSTOSCOPY N/A 03/31/2016   Procedure: CYSTOSCOPY;  Surgeon: Sanjuana Kava, MD;  Location: [facility];  Service: Gynecology;  Laterality: N/A;   EYE SURGERY  12/05/2020   laser surgery left   EYE SURGERY  11/07/2020   laser surgery rt    Social History   Occupational History   Occupation: admin assist  Tobacco Use   Smoking status: Never   Smokeless tobacco: Never  Vaping Use   Vaping Use: Never used  Substance and Sexual Activity   Alcohol use: No    Alcohol/week: 0.0 standard drinks   Drug use: No   Sexual activity: Yes    Birth control/protection: Surgical    Comment: 1st intercourse 51 yo-Fewer than 5 partners-Tubal lig

## 2021-10-01 MED ORDER — BUPIVACAINE HCL 0.25 % IJ SOLN
2.0000 mL | INTRAMUSCULAR | Status: AC | PRN
  Administered 2021-09-30: 2 mL via INTRA_ARTICULAR

## 2021-10-01 MED ORDER — LIDOCAINE HCL 1 % IJ SOLN
2.0000 mL | INTRAMUSCULAR | Status: AC | PRN
  Administered 2021-09-30: 2 mL

## 2021-10-01 MED ORDER — METHYLPREDNISOLONE ACETATE 40 MG/ML IJ SUSP
40.0000 mg | INTRAMUSCULAR | Status: AC | PRN
  Administered 2021-09-30: 40 mg via INTRA_ARTICULAR

## 2022-07-06 ENCOUNTER — Other Ambulatory Visit: Payer: Self-pay | Admitting: Family Medicine

## 2022-09-21 ENCOUNTER — Encounter: Payer: PRIVATE HEALTH INSURANCE | Admitting: Family Medicine

## 2022-09-22 ENCOUNTER — Encounter: Payer: Self-pay | Admitting: Family Medicine

## 2022-09-22 ENCOUNTER — Ambulatory Visit (INDEPENDENT_AMBULATORY_CARE_PROVIDER_SITE_OTHER): Payer: Managed Care, Other (non HMO) | Admitting: Family Medicine

## 2022-09-22 VITALS — BP 130/82 | HR 74 | Temp 97.3°F | Resp 17 | Ht 59.0 in | Wt 204.2 lb

## 2022-09-22 DIAGNOSIS — Z0000 Encounter for general adult medical examination without abnormal findings: Secondary | ICD-10-CM

## 2022-09-22 DIAGNOSIS — E559 Vitamin D deficiency, unspecified: Secondary | ICD-10-CM

## 2022-09-22 NOTE — Progress Notes (Signed)
   Subjective:    Patient ID: Cheyenne Larsen, female    DOB: Apr 30, 1971, 52 y.o.   MRN: [id number]  HPI CPE- due for mammo (scheduled for next month), UTD on Tdap, colonoscopy, Zoster.  No need for pap due to TAH.  Patient Care Team    Relationship Specialty Notifications Start End  Midge Minium, MD PCP - General   08/06/10   Sanjuana Kava, MD (Inactive) Referring Physician Obstetrics and Gynecology  07/19/18     Health Maintenance  Topic Date Due   MAMMOGRAM  07/24/2021   DTaP/Tdap/Td (2 - Td or Tdap) 08/10/2026   COLONOSCOPY (Pts 45-47yr Insurance coverage will need to be confirmed)  05/04/2027   Hepatitis C Screening  Completed   Zoster Vaccines- Shingrix  Completed   HPV VACCINES  Aged Out   COVID-19 Vaccine  Discontinued   HIV Screening  Discontinued      Review of Systems Patient reports no vision/ hearing changes, adenopathy,fever, persistant/recurrent hoarseness , swallowing issues, chest pain, palpitations, edema, persistant/recurrent cough, hemoptysis, dyspnea (rest/exertional/paroxysmal nocturnal), gastrointestinal bleeding (melena, rectal bleeding), abdominal pain, significant heartburn, bowel changes, GU symptoms (dysuria, hematuria, incontinence), Gyn symptoms (abnormal  bleeding, pain),  syncope, focal weakness, memory loss, numbness & tingling, skin/hair/nail changes, abnormal bruising or bleeding, anxiety, or depression.   + 8 lb weight gain    Objective:   Physical Exam General Appearance:    Alert, cooperative, no distress, appears stated age, obese  Head:    Normocephalic, without obvious abnormality, atraumatic  Eyes:    PERRL, conjunctiva/corneas clear, EOM's intact both eyes  Ears:    Normal TM's and external ear canals, both ears  Nose:   Nares normal, septum midline, mucosa normal, no drainage    or sinus tenderness  Throat:   Lips, mucosa, and tongue normal; teeth and gums normal  Neck:   Supple, symmetrical, trachea midline, no  adenopathy;    Thyroid: no enlargement/tenderness/nodules  Back:     Symmetric, no curvature, ROM normal, no CVA tenderness  Lungs:     Clear to auscultation bilaterally, respirations unlabored  Chest Wall:    No tenderness or deformity   Heart:    Regular rate and rhythm, S1 and S2 normal, no murmur, rub   or gallop  Breast Exam:    Deferred to GYN  Abdomen:     Soft, non-tender, bowel sounds active all four quadrants,    no masses, no organomegaly  Genitalia:    Deferred to GYN  Rectal:    Extremities:   Extremities normal, atraumatic, no cyanosis or edema  Pulses:   2+ and symmetric all extremities  Skin:   Skin color, texture, turgor normal, no rashes or lesions  Lymph nodes:   Cervical, supraclavicular, and axillary nodes normal  Neurologic:   CNII-XII intact, normal strength, sensation and reflexes    throughout          Assessment & Plan:

## 2022-09-22 NOTE — Assessment & Plan Note (Signed)
New.  Pt has gained 8 lbs since last visit which now crosses the BMI threshold of 40.  BMI is now 41.25.  Stressed need for healthy diet- low carb, low sugar- and regular exercise.  Check labs to risk stratify.  Will follow.

## 2022-09-22 NOTE — Assessment & Plan Note (Signed)
Check labs and replete prn. 

## 2022-09-22 NOTE — Patient Instructions (Addendum)
Follow up in 6 months to recheck weight loss progress Schedule a lab visit here at your convenience- or go to [facility] for labs Increase your water to intake Continue to work on low carb, low sugar diet- you can do it!!! Goal is for 30 minutes of exercise 4-5x/week.  This can be broken up into 3 10 minute sessions, 2 15 minute sessions, etc.  Any movement is good movement! Have them send me a copy of your mammogram Call with any questions or concerns Stay Safe!  Stay Healthy! HAPPY BELATED BIRTHDAY!!!

## 2022-09-22 NOTE — Assessment & Plan Note (Signed)
Pt's PE WNL w/ exception of BMI.  Has mammo scheduled- will send report.  UTD on colonoscopy, Tdap, Zoster.  Check labs.  Anticipatory guidance provided.

## 2022-10-16 ENCOUNTER — Telehealth: Payer: Self-pay

## 2022-10-16 NOTE — Telephone Encounter (Signed)
Left pt a VM that we are unable to complete her Biometric form due to she never got her labs done

## 2022-12-19 LAB — LAB REPORT - SCANNED
A1c: 7
EGFR: 103

## 2023-03-12 ENCOUNTER — Telehealth: Payer: Self-pay | Admitting: Family Medicine

## 2023-03-12 ENCOUNTER — Encounter: Payer: Self-pay | Admitting: Family Medicine

## 2023-03-12 ENCOUNTER — Ambulatory Visit: Payer: Managed Care, Other (non HMO) | Admitting: Family Medicine

## 2023-03-12 VITALS — BP 130/82 | HR 87 | Temp 98.1°F | Resp 18 | Ht 59.0 in | Wt 201.0 lb

## 2023-03-12 DIAGNOSIS — E785 Hyperlipidemia, unspecified: Secondary | ICD-10-CM

## 2023-03-12 DIAGNOSIS — E559 Vitamin D deficiency, unspecified: Secondary | ICD-10-CM | POA: Diagnosis not present

## 2023-03-12 DIAGNOSIS — E1169 Type 2 diabetes mellitus with other specified complication: Secondary | ICD-10-CM | POA: Diagnosis not present

## 2023-03-12 DIAGNOSIS — Z1231 Encounter for screening mammogram for malignant neoplasm of breast: Secondary | ICD-10-CM

## 2023-03-12 DIAGNOSIS — E119 Type 2 diabetes mellitus without complications: Secondary | ICD-10-CM

## 2023-03-12 MED ORDER — METFORMIN HCL ER 500 MG PO TB24: 500.0000 mg | ORAL_TABLET | Freq: Every day | ORAL | 3 refills | Status: DC

## 2023-03-12 NOTE — Telephone Encounter (Signed)
Will fax to new office in [facility]

## 2023-03-12 NOTE — Patient Instructions (Signed)
Follow up in 3 months to recheck diabetes We'll notify you of your lab results and make any changes if needed Once we see the cholesterol, we'll determine if we need to start medication START the Metformin once daily- take w/ food Continue to drink LOTS of water Try and limit carbs and sugars I'm SO proud of you for exercising!!! Call with any questions or concerns Hang in there!!!

## 2023-03-12 NOTE — Telephone Encounter (Signed)
Caller name: [facility]  On DPR?: Yes  Call back number: [phone]  Provider they see: Sheliah Hatch, MD  Reason for call: Pt has never been there

## 2023-03-12 NOTE — Progress Notes (Unsigned)
   Subjective:    Patient ID: Cheyenne Larsen, female    DOB: [birth year], 52 y.o.   MRN: [id number]  HPI Diabetes- pt had labs done at [facility] in April and A1C was 7%.  LDL was 116.  + family hx of diabetes- Dad and Brother.  Pt reports she is exercising 6x/week- doing cardio, lifting weight, walking around the neighborhood.  Pt has tried to change diet.  Reports she has had diarrhea recently.  UTD on eye exam 5/13 (has glaucoma- Dr Edrick Oh).  Due for foot exam and microalbumin.  Increased thirst.  No CP, SOB, HA's, visual changes, edema.  + numbness/tingling of hands/feet.  Hyperlipidemia- recent LDL 116.  Now that she has diabetes goal is <70   Review of Systems For ROS see HPI     Objective:   Physical Exam Vitals reviewed.  Constitutional:      General: She is not in acute distress.    Appearance: Normal appearance. She is well-developed. She is not ill-appearing.  HENT:     Head: Normocephalic and atraumatic.  Eyes:     Conjunctiva/sclera: Conjunctivae normal.     Pupils: Pupils are equal, round, and reactive to light.  Neck:     Thyroid: No thyromegaly.  Cardiovascular:     Rate and Rhythm: Normal rate and regular rhythm.     Pulses: Normal pulses.     Heart sounds: Normal heart sounds. No murmur heard. Pulmonary:     Effort: Pulmonary effort is normal. No respiratory distress.     Breath sounds: Normal breath sounds.  Abdominal:     General: There is no distension.     Palpations: Abdomen is soft.     Tenderness: There is no abdominal tenderness.  Musculoskeletal:     Cervical back: Normal range of motion and neck supple.     Right lower leg: No edema.     Left lower leg: No edema.  Lymphadenopathy:     Cervical: No cervical adenopathy.  Skin:    General: Skin is warm and dry.  Neurological:     General: No focal deficit present.     Mental Status: She is alert and oriented to person, place, and time.  Psychiatric:        Mood and Affect:  Mood normal.        Behavior: Behavior normal.        Thought Content: Thought content normal.           Assessment & Plan:

## 2023-03-13 LAB — BASIC METABOLIC PANEL
BUN: 11 mg/dL (ref 7–25)
Calcium: 9.3 mg/dL (ref 8.6–10.4)
Creat: 0.72 mg/dL (ref 0.50–1.03)
Glucose, Bld: 90 mg/dL (ref 65–99)
Sodium: 140 mmol/L (ref 135–146)

## 2023-03-13 LAB — CBC WITH DIFFERENTIAL/PLATELET
Absolute Monocytes: 464 cells/uL (ref 200–950)
Basophils Relative: 0.5 %
Eosinophils Absolute: 261 cells/uL (ref 15–500)
HCT: 43 % (ref 35.0–45.0)
Hemoglobin: 13.5 g/dL (ref 11.7–15.5)
Lymphs Abs: 2088 cells/uL (ref 850–3900)
MCHC: 31.4 g/dL — ABNORMAL LOW (ref 32.0–36.0)
Monocytes Relative: 8 %
Neutro Abs: 2958 cells/uL (ref 1500–7800)
Neutrophils Relative %: 51 %
Platelets: 357 10*3/uL (ref 140–400)

## 2023-03-13 LAB — LIPID PANEL
HDL: 46 mg/dL — ABNORMAL LOW (ref >=50)
LDL Cholesterol (Calc): 101 mg/dL (calc) — ABNORMAL HIGH
Non-HDL Cholesterol (Calc): 118 mg/dL (calc) (ref <130)
Triglycerides: 77 mg/dL (ref <150)

## 2023-03-13 LAB — HEMOGLOBIN A1C
Hgb A1c MFr Bld: 6.4 % of total Hgb — ABNORMAL HIGH (ref <5.7)
Mean Plasma Glucose: 137 mg/dL
eAG (mmol/L): 7.6 mmol/L

## 2023-03-13 LAB — HEPATIC FUNCTION PANEL
AG Ratio: 1.5 (calc) (ref 1.0–2.5)
ALT: 28 U/L (ref 6–29)
AST: 19 U/L (ref 10–35)
Albumin: 4 g/dL (ref 3.6–5.1)
Alkaline phosphatase (APISO): 82 U/L (ref 37–153)
Bilirubin, Direct: 0.1 mg/dL (ref 0.0–0.2)
Globulin: 2.6 g/dL (calc) (ref 1.9–3.7)

## 2023-03-13 LAB — TSH: TSH: 1.1 mIU/L

## 2023-03-15 ENCOUNTER — Other Ambulatory Visit: Payer: Self-pay

## 2023-03-15 ENCOUNTER — Telehealth: Payer: Self-pay

## 2023-03-15 DIAGNOSIS — E1169 Type 2 diabetes mellitus with other specified complication: Secondary | ICD-10-CM

## 2023-03-15 MED ORDER — ROSUVASTATIN CALCIUM 5 MG PO TABS
5.0000 mg | ORAL_TABLET | Freq: Every day | ORAL | 3 refills | Status: DC
Start: 2023-03-15 — End: 2023-03-23

## 2023-03-15 NOTE — Telephone Encounter (Signed)
Pt Is aware of the lab results . Crestor 5 mg has been sent in and pt states she will call back to schedule her 3 month follow up

## 2023-03-16 LAB — CBC WITH DIFFERENTIAL/PLATELET
Basophils Absolute: 29 cells/uL (ref 0–200)
Eosinophils Relative: 4.5 %
MCH: 26.4 pg — ABNORMAL LOW (ref 27.0–33.0)
MCV: 84.1 fL (ref 80.0–100.0)
MPV: 11.3 fL (ref 7.5–12.5)
RBC: 5.11 10*6/uL — ABNORMAL HIGH (ref 3.80–5.10)
RDW: 13.6 % (ref 11.0–15.0)
Total Lymphocyte: 36 %
WBC: 5.8 10*3/uL (ref 3.8–10.8)

## 2023-03-16 LAB — HEPATIC FUNCTION PANEL
Indirect Bilirubin: 0.4 mg/dL (calc) (ref 0.2–1.2)
Total Bilirubin: 0.5 mg/dL (ref 0.2–1.2)
Total Protein: 6.6 g/dL (ref 6.1–8.1)

## 2023-03-16 LAB — BASIC METABOLIC PANEL
CO2: 24 mmol/L (ref 20–32)
Chloride: 108 mmol/L (ref 98–110)
Potassium: 4.4 mmol/L (ref 3.5–5.3)

## 2023-03-16 LAB — MICROALBUMIN / CREATININE URINE RATIO

## 2023-03-16 LAB — LIPID PANEL
Cholesterol: 164 mg/dL (ref <200)
Total CHOL/HDL Ratio: 3.6 (calc) (ref <5.0)

## 2023-03-16 NOTE — Assessment & Plan Note (Signed)
New.  Pt's LDL was 116 this spring.  Will repeat labs to see if diet and exercise have decreased this #.  Discussed that LDL goal w/ diabetes is <70.  Will start statin if needed.  Pt expressed understanding and is in agreement w/ plan.

## 2023-03-16 NOTE — Assessment & Plan Note (Signed)
New.  Pt had labs done at [facility] and A1C was 7%.  She has a family hx of diabetes- dad and brother.  Since her dx, she has been exercising 6x.week and is attempting to change diet.  She is UTD on eye exam due to her dx of glaucoma.  Foot exam done today.  Microalbumin ordered.  Other than increased thirst and some numbness/tingling of hands/feet- she is asymptomatic.  Applauded her efforts at healthy diet and regular exercise.  Encouraged her to continue.  Will start Metformin 500mg  BID and adjust prn.  Pt expressed understanding and is in agreement w/ plan.

## 2023-03-23 ENCOUNTER — Other Ambulatory Visit: Payer: Self-pay

## 2023-03-23 ENCOUNTER — Telehealth: Payer: Self-pay | Admitting: Family Medicine

## 2023-03-23 DIAGNOSIS — E1169 Type 2 diabetes mellitus with other specified complication: Secondary | ICD-10-CM

## 2023-03-23 MED ORDER — ROSUVASTATIN CALCIUM 5 MG PO TABS
5.0000 mg | ORAL_TABLET | Freq: Every day | ORAL | 1 refills | Status: DC
Start: 2023-03-23 — End: 2023-07-02

## 2023-03-23 MED ORDER — METFORMIN HCL ER 500 MG PO TB24: 500.0000 mg | ORAL_TABLET | Freq: Every day | ORAL | 1 refills | Status: DC

## 2023-03-23 NOTE — Telephone Encounter (Signed)
Encourage patient to contact the pharmacy for refills or they can request refills through [facility]   WHAT PHARMACY WOULD THEY LIKE THIS SENT TO:  [id number] - [place], [place] - [address]   MEDICATION NAME & DOSE: metFORMIN (GLUCOPHAGE-XR) 500 MG 24 hr tablet  rosuvastatin (CRESTOR) 5 MG tablet  NOTES/COMMENTS FROM PATIENT:  [employer] will only pay in 90ct prescriptions    Front office please notify patient: It takes 48-72 hours to process rx refill requests Ask patient to call pharmacy to ensure rx is ready before heading there.

## 2023-03-23 NOTE — Telephone Encounter (Signed)
I have sent in a 90 day supply to pharmacy

## 2023-07-02 ENCOUNTER — Encounter: Payer: Self-pay | Admitting: Family Medicine

## 2023-07-02 ENCOUNTER — Ambulatory Visit: Payer: Managed Care, Other (non HMO) | Admitting: Family Medicine

## 2023-07-02 VITALS — BP 134/78 | HR 76 | Temp 97.7°F | Ht 60.0 in | Wt 192.0 lb

## 2023-07-02 DIAGNOSIS — E119 Type 2 diabetes mellitus without complications: Secondary | ICD-10-CM

## 2023-07-02 DIAGNOSIS — Z7984 Long term (current) use of oral hypoglycemic drugs: Secondary | ICD-10-CM

## 2023-07-02 DIAGNOSIS — E1169 Type 2 diabetes mellitus with other specified complication: Secondary | ICD-10-CM | POA: Diagnosis not present

## 2023-07-02 DIAGNOSIS — E785 Hyperlipidemia, unspecified: Secondary | ICD-10-CM | POA: Diagnosis not present

## 2023-07-02 LAB — CBC WITH DIFFERENTIAL/PLATELET
Basophils Absolute: 0.1 10*3/uL (ref 0.0–0.1)
Basophils Relative: 1.1 % (ref 0.0–3.0)
Eosinophils Absolute: 0.2 10*3/uL (ref 0.0–0.7)
Eosinophils Relative: 3.6 % (ref 0.0–5.0)
HCT: 40.2 % (ref 36.0–46.0)
Hemoglobin: 13.1 g/dL (ref 12.0–15.0)
Lymphocytes Relative: 34.7 % (ref 12.0–46.0)
Lymphs Abs: 2.4 10*3/uL (ref 0.7–4.0)
MCHC: 32.6 g/dL (ref 30.0–36.0)
MCV: 82.5 fL (ref 78.0–100.0)
Monocytes Absolute: 0.4 10*3/uL (ref 0.1–1.0)
Monocytes Relative: 6.1 % (ref 3.0–12.0)
Neutro Abs: 3.8 10*3/uL (ref 1.4–7.7)
Neutrophils Relative %: 54.5 % (ref 43.0–77.0)
Platelets: 336 10*3/uL (ref 150.0–400.0)
RBC: 4.88 Mil/uL (ref 3.87–5.11)
RDW: 14.8 % (ref 11.5–15.5)
WBC: 6.9 10*3/uL (ref 4.0–10.5)

## 2023-07-02 LAB — HEPATIC FUNCTION PANEL
ALT: 18 U/L (ref 0–35)
AST: 14 U/L (ref 0–37)
Albumin: 4.3 g/dL (ref 3.5–5.2)
Alkaline Phosphatase: 78 U/L (ref 39–117)
Bilirubin, Direct: 0.1 mg/dL (ref 0.0–0.3)
Total Bilirubin: 0.5 mg/dL (ref 0.2–1.2)
Total Protein: 7.1 g/dL (ref 6.0–8.3)

## 2023-07-02 LAB — MICROALBUMIN / CREATININE URINE RATIO
Creatinine,U: 133.9 mg/dL
Microalb Creat Ratio: 1.1 mg/g (ref 0.0–30.0)
Microalb, Ur: 1.5 mg/dL (ref 0.0–1.9)

## 2023-07-02 LAB — LIPID PANEL
Cholesterol: 120 mg/dL (ref 0–200)
HDL: 44.6 mg/dL (ref >39.00)
LDL Cholesterol: 65 mg/dL (ref 0–99)
NonHDL: 75.25
Total CHOL/HDL Ratio: 3
Triglycerides: 53 mg/dL (ref 0.0–149.0)
VLDL: 10.6 mg/dL (ref 0.0–40.0)

## 2023-07-02 LAB — BASIC METABOLIC PANEL
BUN: 11 mg/dL (ref 6–23)
CO2: 27 meq/L (ref 19–32)
Calcium: 9.1 mg/dL (ref 8.4–10.5)
Chloride: 107 meq/L (ref 96–112)
Creatinine, Ser: 0.81 mg/dL (ref 0.40–1.20)
GFR: 82.66 mL/min (ref >60.00)
Glucose, Bld: 76 mg/dL (ref 70–99)
Potassium: 4.1 meq/L (ref 3.5–5.1)
Sodium: 141 meq/L (ref 135–145)

## 2023-07-02 LAB — TSH: TSH: 1.82 u[IU]/mL (ref 0.35–5.50)

## 2023-07-02 LAB — HEMOGLOBIN A1C: Hgb A1c MFr Bld: 6.7 % — ABNORMAL HIGH (ref 4.6–6.5)

## 2023-07-02 MED ORDER — ROSUVASTATIN CALCIUM 5 MG PO TABS
5.0000 mg | ORAL_TABLET | Freq: Every day | ORAL | 1 refills | Status: DC
Start: 2023-07-02 — End: 2023-11-04

## 2023-07-02 NOTE — Progress Notes (Signed)
   Subjective:    Patient ID: Wendelyn Breslow, female    DOB: 17-Jun-1971, 52 y.o.   MRN: [id number]  HPI DM- ongoing issue.  Last visit A1C had dropped from 7 --> 6.4%.  UTD on eye exam, foot exam.  Due for microalbumin. Pt is down 10 lbs.  Pt reports having abd pain intermittently since starting Metformin.  + diarrhea.  No CP, SOB, HA's, visual changes.  Denies symptomatic lows.  No numbness/tingling of hands/feet  Hyperlipidemia- after last visit we started Crestor 5mg  daily bc LDL was 101.  Obesity- pt is down 10 lbs since July!  Has changed her eating.  Review of Systems For ROS see HPI     Objective:   Physical Exam Vitals reviewed.  Constitutional:      General: She is not in acute distress.    Appearance: Normal appearance. She is well-developed. She is not ill-appearing.  HENT:     Head: Normocephalic and atraumatic.  Eyes:     Conjunctiva/sclera: Conjunctivae normal.     Pupils: Pupils are equal, round, and reactive to light.  Neck:     Thyroid: No thyromegaly.  Cardiovascular:     Rate and Rhythm: Normal rate and regular rhythm.     Pulses: Normal pulses.     Heart sounds: Normal heart sounds. No murmur heard. Pulmonary:     Effort: Pulmonary effort is normal. No respiratory distress.     Breath sounds: Normal breath sounds.  Abdominal:     General: There is no distension.     Palpations: Abdomen is soft.     Tenderness: There is no abdominal tenderness.  Musculoskeletal:     Cervical back: Normal range of motion and neck supple.  Lymphadenopathy:     Cervical: No cervical adenopathy.  Skin:    General: Skin is warm and dry.  Neurological:     General: No focal deficit present.     Mental Status: She is alert and oriented to person, place, and time.  Psychiatric:        Mood and Affect: Mood normal.        Behavior: Behavior normal.        Thought Content: Thought content normal.           Assessment & Plan:

## 2023-07-02 NOTE — Assessment & Plan Note (Signed)
Started Crestor 5mg  at last visit.  Has lost 10 lbs since last visit.  Applauded her efforts.  Check labs.  Adjust meds prn

## 2023-07-02 NOTE — Assessment & Plan Note (Signed)
Ongoing issue.  Is down 10 lbs by adjusting her eating.  Applauded her efforts.  Will continue to follow.

## 2023-07-02 NOTE — Assessment & Plan Note (Signed)
Chronic problem.  Last A1C was excellent at 6.4%  She is having diarrhea and abdominal pain on Metformin so will hold medication and see if sxs improve.  UTD on foot exam, eye exam.  Will check kidneys today.  Pt expressed understanding and is in agreement w/ plan.

## 2023-07-02 NOTE — Patient Instructions (Signed)
Schedule your complete physical in 3 months We'll notify you of your lab results and make any changes if needed Continue to work on healthy diet and regular exercise- you look great! STOP the Metformin CONTINUE the Rosuvastatin for Crestor Call with any questions or concerns Stay Safe!  Stay Healthy! Happy Fall!!!

## 2023-10-26 NOTE — Telephone Encounter (Signed)
 Just clarifying, Per your last note on 07/02/2023 you wanted patient to return in 3 months for a complete physical. Patient is scheduled for 11/04/2023 as A1C Follow Up. There are no openings for complete physicals until May 14th, 2025.   Was her complete physical and A1C suppose to be 2 separate appointments or were we going to do both at one appointment?

## 2023-11-04 ENCOUNTER — Ambulatory Visit (INDEPENDENT_AMBULATORY_CARE_PROVIDER_SITE_OTHER): Admitting: Family Medicine

## 2023-11-04 ENCOUNTER — Other Ambulatory Visit

## 2023-11-04 ENCOUNTER — Encounter: Payer: Self-pay | Admitting: Family Medicine

## 2023-11-04 ENCOUNTER — Ambulatory Visit: Admitting: Family Medicine

## 2023-11-04 VITALS — BP 128/78 | HR 97 | Temp 98.2°F | Ht 59.5 in | Wt 192.4 lb

## 2023-11-04 DIAGNOSIS — Z0000 Encounter for general adult medical examination without abnormal findings: Secondary | ICD-10-CM | POA: Diagnosis not present

## 2023-11-04 DIAGNOSIS — E1169 Type 2 diabetes mellitus with other specified complication: Secondary | ICD-10-CM

## 2023-11-04 DIAGNOSIS — E559 Vitamin D deficiency, unspecified: Secondary | ICD-10-CM

## 2023-11-04 DIAGNOSIS — E119 Type 2 diabetes mellitus without complications: Secondary | ICD-10-CM

## 2023-11-04 DIAGNOSIS — E785 Hyperlipidemia, unspecified: Secondary | ICD-10-CM | POA: Diagnosis not present

## 2023-11-04 DIAGNOSIS — Z114 Encounter for screening for human immunodeficiency virus [HIV]: Secondary | ICD-10-CM

## 2023-11-04 LAB — LIPID PANEL
Cholesterol: 147 mg/dL (ref 0–200)
HDL: 49 mg/dL (ref >39.00)
LDL Cholesterol: 81 mg/dL (ref 0–99)
NonHDL: 98.01
Total CHOL/HDL Ratio: 3
Triglycerides: 83 mg/dL (ref 0.0–149.0)
VLDL: 16.6 mg/dL (ref 0.0–40.0)

## 2023-11-04 LAB — CBC WITH DIFFERENTIAL/PLATELET
Basophils Absolute: 0.1 10*3/uL (ref 0.0–0.1)
Basophils Relative: 0.9 % (ref 0.0–3.0)
Eosinophils Absolute: 0.2 10*3/uL (ref 0.0–0.7)
Eosinophils Relative: 3.3 % (ref 0.0–5.0)
HCT: 43.5 % (ref 36.0–46.0)
Hemoglobin: 14 g/dL (ref 12.0–15.0)
Lymphocytes Relative: 36.1 % (ref 12.0–46.0)
Lymphs Abs: 2.3 10*3/uL (ref 0.7–4.0)
MCHC: 32.2 g/dL (ref 30.0–36.0)
MCV: 83.5 fl (ref 78.0–100.0)
Monocytes Absolute: 0.4 10*3/uL (ref 0.1–1.0)
Monocytes Relative: 5.7 % (ref 3.0–12.0)
Neutro Abs: 3.4 10*3/uL (ref 1.4–7.7)
Neutrophils Relative %: 54 % (ref 43.0–77.0)
Platelets: 320 10*3/uL (ref 150.0–400.0)
RBC: 5.21 Mil/uL — ABNORMAL HIGH (ref 3.87–5.11)
RDW: 14.4 % (ref 11.5–15.5)
WBC: 6.2 10*3/uL (ref 4.0–10.5)

## 2023-11-04 LAB — MICROALBUMIN / CREATININE URINE RATIO
Creatinine,U: 145.2 mg/dL
Microalb Creat Ratio: 14.3 mg/g (ref 0.0–30.0)
Microalb, Ur: 2.1 mg/dL — ABNORMAL HIGH (ref 0.0–1.9)

## 2023-11-04 LAB — HEPATIC FUNCTION PANEL
ALT: 19 U/L (ref 0–35)
AST: 17 U/L (ref 0–37)
Albumin: 4.6 g/dL (ref 3.5–5.2)
Alkaline Phosphatase: 73 U/L (ref 39–117)
Bilirubin, Direct: 0.2 mg/dL (ref 0.0–0.3)
Total Bilirubin: 0.5 mg/dL (ref 0.2–1.2)
Total Protein: 7.6 g/dL (ref 6.0–8.3)

## 2023-11-04 LAB — TSH: TSH: 1.12 u[IU]/mL (ref 0.35–5.50)

## 2023-11-04 LAB — BASIC METABOLIC PANEL
BUN: 12 mg/dL (ref 6–23)
CO2: 27 meq/L (ref 19–32)
Calcium: 9.6 mg/dL (ref 8.4–10.5)
Chloride: 105 meq/L (ref 96–112)
Creatinine, Ser: 0.79 mg/dL (ref 0.40–1.20)
GFR: 84.97 mL/min (ref >60.00)
Glucose, Bld: 76 mg/dL (ref 70–99)
Potassium: 4.1 meq/L (ref 3.5–5.1)
Sodium: 140 meq/L (ref 135–145)

## 2023-11-04 LAB — VITAMIN D 25 HYDROXY (VIT D DEFICIENCY, FRACTURES): VITD: 40.79 ng/mL (ref 30.00–100.00)

## 2023-11-04 MED ORDER — HYDROCORTISONE ACETATE 25 MG RE SUPP: 25.0000 mg | Freq: Two times a day (BID) | RECTAL | 0 refills | Status: AC

## 2023-11-04 MED ORDER — ROSUVASTATIN CALCIUM 5 MG PO TABS: 5.0000 mg | ORAL_TABLET | Freq: Every day | ORAL | 1 refills | Status: AC

## 2023-11-04 MED ORDER — OMEPRAZOLE 40 MG PO CPDR: 40.0000 mg | DELAYED_RELEASE_CAPSULE | Freq: Every day | ORAL | 0 refills | Status: AC

## 2023-11-04 NOTE — Patient Instructions (Signed)
 Follow up in 3-4 months to recheck sugars We'll notify you of your lab results and make any changes if needed Continue to work on healthy diet and regular exercise- you're doing great! If you change your mind about mood medicine, let me know! I think counseling is a great idea!! Call with any questions or concerns Stay Safe!  Stay Healthy! Hang in there!!

## 2023-11-04 NOTE — Assessment & Plan Note (Signed)
 Pt's PE WNL w/ exception of BMI.  UTD on eye exam, foot exam, mammo, colonoscopy, Tdap.  Check labs.  Anticipatory guidance provided.

## 2023-11-04 NOTE — Progress Notes (Signed)
   Subjective:    Patient ID: Cheyenne Larsen, female    DOB: [birth year], 53 y.o.   MRN: [id number]  HPI CPE- UTD on eye exam, mammo, foot exam  Patient Care Team    Relationship Specialty Notifications Start End  Sheliah Hatch, MD PCP - General   08/06/10   Essie Hart, MD (Inactive) Referring Physician Obstetrics and Gynecology  07/19/18   Daisy Lazar, MD Consulting Physician Ophthalmology  03/12/23     Health Maintenance  Topic Date Due   Pneumococcal Vaccine 40-49 Years old (1 of 2 - PCV) Never done   OPHTHALMOLOGY EXAM  Never done   HIV Screening  Never done   MAMMOGRAM  07/24/2021   COVID-19 Vaccine (4 - 2024-25 season) 04/25/2023   HEMOGLOBIN A1C  12/30/2023   FOOT EXAM  03/11/2024   Diabetic kidney evaluation - eGFR measurement  07/01/2024   Diabetic kidney evaluation - Urine ACR  07/01/2024   DTaP/Tdap/Td (2 - Td or Tdap) 08/10/2026   Colonoscopy  05/04/2027   Hepatitis C Screening  Completed   Zoster Vaccines- Shingrix  Completed   HPV VACCINES  Aged Out      Review of Systems Patient reports no vision/ hearing changes, adenopathy,fever, weight change,  persistant/recurrent hoarseness , swallowing issues, chest pain, palpitations, edema, persistant/recurrent cough, hemoptysis, dyspnea (rest/exertional/paroxysmal nocturnal), gastrointestinal bleeding (melena, rectal bleeding), abdominal pain, significant heartburn, bowel changes, GU symptoms (dysuria, hematuria, incontinence), Gyn symptoms (abnormal  bleeding, pain),  syncope, focal weakness, memory loss, numbness & tingling, skin/hair/nail changes, abnormal bruising or bleeding.  + depression- currently separating from husband.  Not interested in meds.     Objective:   Physical Exam General Appearance:    Alert, cooperative, no distress, appears stated age  Head:    Normocephalic, without obvious abnormality, atraumatic  Eyes:    PERRL, conjunctiva/corneas clear, EOM's intact both eyes  Ears:     Normal TM's and external ear canals, both ears  Nose:   Nares normal, septum midline, mucosa normal, no drainage    or sinus tenderness  Throat:   Lips, mucosa, and tongue normal; teeth and gums normal  Neck:   Supple, symmetrical, trachea midline, no adenopathy;    Thyroid: no enlargement/tenderness/nodules  Back:     Symmetric, no curvature, ROM normal, no CVA tenderness  Lungs:     Clear to auscultation bilaterally, respirations unlabored  Chest Wall:    No tenderness or deformity   Heart:    Regular rate and rhythm, S1 and S2 normal, no murmur, rub   or gallop  Breast Exam:    Deferred to mammo  Abdomen:     Soft, non-tender, bowel sounds active all four quadrants,    no masses, no organomegaly  Genitalia:    Deferred  Rectal:    Extremities:   Extremities normal, atraumatic, no cyanosis or edema  Pulses:   2+ and symmetric all extremities  Skin:   Skin color, texture, turgor normal, no rashes or lesions  Lymph nodes:   Cervical, supraclavicular, and axillary nodes normal  Neurologic:   CNII-XII intact, normal strength, sensation and reflexes    throughout          Assessment & Plan:

## 2023-11-05 LAB — HEMOGLOBIN A1C
Est. average glucose Bld gHb Est-mCnc: 128 mg/dL
Hgb A1c MFr Bld: 6.1 % — ABNORMAL HIGH (ref 4.8–5.6)

## 2023-11-05 LAB — HIV ANTIBODY (ROUTINE TESTING W REFLEX): HIV 1&2 Ab, 4th Generation: NONREACTIVE

## 2023-11-08 ENCOUNTER — Encounter: Payer: Self-pay | Admitting: Family Medicine

## 2023-11-08 MED ORDER — OZEMPIC (0.25 OR 0.5 MG/DOSE) 2 MG/3ML ~~LOC~~ SOPN: 0.2500 mg | PEN_INJECTOR | SUBCUTANEOUS | 1 refills | Status: DC

## 2023-11-09 NOTE — Telephone Encounter (Signed)
 We have a savings card/pamphlet here in the office that you can stop by to pick up if you'd like. I will have it at the front desk for you. Please let me know if there is anything else I can do for you!   Lenard Simmer, CMA Practice Leader III [employer] Phone: [phone] Fax: [phone] *My chart messages may take 48-72 hours to address once received. If you need anything sooner, please call our office at [phone]*

## 2023-12-03 ENCOUNTER — Telehealth: Payer: Self-pay

## 2023-12-03 ENCOUNTER — Other Ambulatory Visit (HOSPITAL_COMMUNITY): Payer: Self-pay

## 2023-12-03 NOTE — Telephone Encounter (Signed)
 Clinical questions have been answered and PA submitted. PA currently Pending.

## 2023-12-03 NOTE — Telephone Encounter (Signed)
 Sent to PA team.

## 2023-12-03 NOTE — Telephone Encounter (Signed)
 PA request has been Submitted. New Encounter has been or will be created for follow up. For additional info see Pharmacy Prior Auth telephone encounter from 12/03/23.

## 2023-12-03 NOTE — Telephone Encounter (Signed)
 Pharmacy Patient Advocate Encounter   Received notification from CoverMyMeds that prior authorization for  Ozempic (0.25 or 0.5 MG/DOSE) 2MG /3ML pen-injectors is required/requested.   Insurance verification completed.   The patient is insured through [employer] .   Per test claim: PA required; PA submitted to above mentioned insurance via CoverMyMeds Key/confirmation #/EOC [id number] Status is pending

## 2023-12-09 NOTE — Telephone Encounter (Signed)
 Pharmacy Patient Advocate Encounter  Received notification from [employer] that Prior Authorization for Ozempic (0.25 or 0.5 MG/DOSE) 2MG /3ML pen-injectors has been DENIED.  Full denial letter will be uploaded to the media tab. See denial reason below.   PA #/Case ID/Reference #: [id number]     *We did tell them that she had Diabetes but they still denied it, I am going to route this to our appeals team, Please note that it can take up to 30 days to reach a decision.

## 2023-12-10 ENCOUNTER — Telehealth: Payer: Self-pay | Admitting: Pharmacist

## 2023-12-10 NOTE — Telephone Encounter (Signed)
 Appeal has been submitted for Ozempic . Will advise when response is received, please be advised that most companies may take 30 days to make a decision. Appeal letter and supporting documentation have been faxed to [phone] on 12/10/2023 @3 :06 pm.  Thank you, Dene Fines, PharmD Clinical Pharmacist  [place]  Direct Dial: [phone]

## 2023-12-15 ENCOUNTER — Other Ambulatory Visit (HOSPITAL_COMMUNITY): Payer: Self-pay

## 2023-12-15 ENCOUNTER — Encounter: Payer: Self-pay | Admitting: Family Medicine

## 2023-12-27 ENCOUNTER — Other Ambulatory Visit (HOSPITAL_COMMUNITY): Payer: Self-pay

## 2024-01-03 ENCOUNTER — Other Ambulatory Visit (HOSPITAL_COMMUNITY): Payer: Self-pay

## 2024-01-05 ENCOUNTER — Telehealth: Payer: Self-pay

## 2024-01-05 ENCOUNTER — Other Ambulatory Visit: Payer: Self-pay

## 2024-01-05 ENCOUNTER — Other Ambulatory Visit (HOSPITAL_COMMUNITY): Payer: Self-pay

## 2024-01-05 MED ORDER — OZEMPIC (0.25 OR 0.5 MG/DOSE) 2 MG/3ML ~~LOC~~ SOPN: 0.2500 mg | PEN_INJECTOR | SUBCUTANEOUS | 1 refills | Status: DC

## 2024-01-05 NOTE — Telephone Encounter (Signed)
 Copied from CRM [id number]. Topic: Clinical - Medication Question >> Jan 05, 2024 12:39 PM Earnestine Goes B wrote: Reason for CRM: [employer] called to advise pt's ozempic  was approved

## 2024-01-05 NOTE — Telephone Encounter (Signed)
 Spoke with pt and she is aware. Sent refill in to pts pharmacy Advised pt if she has any issues to call our office

## 2024-01-05 NOTE — Telephone Encounter (Signed)
 The appeal for Ozempic  has been approved by [employer], pharmacy notified and script filled.

## 2024-01-07 ENCOUNTER — Telehealth: Payer: Self-pay

## 2024-01-07 ENCOUNTER — Other Ambulatory Visit (HOSPITAL_COMMUNITY): Payer: Self-pay

## 2024-01-07 NOTE — Telephone Encounter (Signed)
 Pharmacy Patient Advocate Encounter   Received notification from Patient Pharmacy that prior authorization for Ozempic  2 is required/requested.   Insurance verification completed.   The patient is insured through [employer] .   Per test claim: Refill too soon. PA is not needed at this time. Medication was filled 01/05/24. Next eligible fill date is 01/18/24.

## 2024-03-23 ENCOUNTER — Other Ambulatory Visit: Payer: Self-pay | Admitting: Medical Genetics

## 2024-04-01 ENCOUNTER — Other Ambulatory Visit
Admission: RE | Admit: 2024-04-01 | Discharge: 2024-04-01 | Disposition: A | Discharge status: Home / Self Care | Payer: Self-pay | Source: Ambulatory Visit | Attending: Medical Genetics | Admitting: Medical Genetics

## 2024-04-11 LAB — GENECONNECT MOLECULAR SCREEN: Genetic Analysis Overall Interpretation: NEGATIVE

## 2024-04-19 ENCOUNTER — Other Ambulatory Visit: Payer: Self-pay | Admitting: Family Medicine

## 2024-04-19 NOTE — Telephone Encounter (Signed)
 Pt has appt scheduled.

## 2024-04-19 NOTE — Telephone Encounter (Signed)
 Pt need appt scheduled. Will call today  Per notes 3-4 months

## 2024-05-04 ENCOUNTER — Encounter: Payer: Self-pay | Admitting: Family Medicine

## 2024-05-04 ENCOUNTER — Ambulatory Visit (INDEPENDENT_AMBULATORY_CARE_PROVIDER_SITE_OTHER): Admitting: Family Medicine

## 2024-05-04 VITALS — BP 110/74 | HR 89 | Temp 99.4°F | Wt 188.4 lb

## 2024-05-04 DIAGNOSIS — E1169 Type 2 diabetes mellitus with other specified complication: Secondary | ICD-10-CM

## 2024-05-04 DIAGNOSIS — E119 Type 2 diabetes mellitus without complications: Secondary | ICD-10-CM

## 2024-05-04 DIAGNOSIS — E785 Hyperlipidemia, unspecified: Secondary | ICD-10-CM

## 2024-05-04 MED ORDER — OZEMPIC (0.25 OR 0.5 MG/DOSE) 2 MG/3ML ~~LOC~~ SOPN: 0.5000 mg | PEN_INJECTOR | SUBCUTANEOUS | 3 refills | Status: DC

## 2024-05-04 NOTE — Progress Notes (Signed)
   Subjective:    Patient ID: Cheyenne Larsen, female    DOB: 08-16-1971, 53 y.o.   MRN: [id number]  HPI DM- ongoing issue.  Last A1C 6.1%.  due for foot exam, eye exam (scheduled for November).  Currently on Ozempic  0.25mg  weekly.  No CP, SOB, HA's, visual changes, edema.  No numbness/tingling of hands/feet.  Hyperlipidemia- chronic problem, on Crestor  5mg  nightly.  Denies abd pain, N/V.  Obesity- pt is down 4 lbs since last visit.  BMI 37.42  Going to the gym 2-3x/week.   Review of Systems For ROS see HPI     Objective:   Physical Exam Vitals reviewed.  Constitutional:      General: She is not in acute distress.    Appearance: Normal appearance. She is well-developed. She is obese. She is not ill-appearing.  HENT:     Head: Normocephalic and atraumatic.  Eyes:     Conjunctiva/sclera: Conjunctivae normal.     Pupils: Pupils are equal, round, and reactive to light.  Neck:     Thyroid : No thyromegaly.  Cardiovascular:     Rate and Rhythm: Normal rate and regular rhythm.     Heart sounds: Normal heart sounds. No murmur heard. Pulmonary:     Effort: Pulmonary effort is normal. No respiratory distress.     Breath sounds: Normal breath sounds.  Abdominal:     General: There is no distension.     Palpations: Abdomen is soft.     Tenderness: There is no abdominal tenderness.  Musculoskeletal:     Cervical back: Normal range of motion and neck supple.  Lymphadenopathy:     Cervical: No cervical adenopathy.  Skin:    General: Skin is warm and dry.  Neurological:     Mental Status: She is alert and oriented to person, place, and time.  Psychiatric:        Behavior: Behavior normal.           Assessment & Plan:

## 2024-05-04 NOTE — Patient Instructions (Signed)
 Schedule your complete physical in 6 months We'll notify you of your lab results and make any changes if needed Continue to work on healthy diet and regular exercise- you can do it!! When you have your eye exam have them send me a copy of their report Call with any questions or concerns Stay Safe!  Stay Healthy! GOOD LUCK WITH THE MOVE!!!

## 2024-05-05 ENCOUNTER — Ambulatory Visit: Payer: Self-pay | Admitting: Family Medicine

## 2024-05-05 LAB — BASIC METABOLIC PANEL WITH GFR
BUN: 8 mg/dL (ref 6–23)
CO2: 27 meq/L (ref 19–32)
Calcium: 9.1 mg/dL (ref 8.4–10.5)
Chloride: 105 meq/L (ref 96–112)
Creatinine, Ser: 0.81 mg/dL (ref 0.40–1.20)
GFR: 82.17 mL/min (ref >60.00)
Glucose, Bld: 66 mg/dL — ABNORMAL LOW (ref 70–99)
Potassium: 4.2 meq/L (ref 3.5–5.1)
Sodium: 139 meq/L (ref 135–145)

## 2024-05-05 LAB — CBC WITH DIFFERENTIAL/PLATELET
Basophils Absolute: 0 K/uL (ref 0.0–0.1)
Basophils Relative: 0.8 % (ref 0.0–3.0)
Eosinophils Absolute: 0.2 K/uL (ref 0.0–0.7)
Eosinophils Relative: 3.6 % (ref 0.0–5.0)
HCT: 39.2 % (ref 36.0–46.0)
Hemoglobin: 12.7 g/dL (ref 12.0–15.0)
Lymphocytes Relative: 44.2 % (ref 12.0–46.0)
Lymphs Abs: 2.8 K/uL (ref 0.7–4.0)
MCHC: 32.5 g/dL (ref 30.0–36.0)
MCV: 82 fl (ref 78.0–100.0)
Monocytes Absolute: 0.4 K/uL (ref 0.1–1.0)
Monocytes Relative: 6.3 % (ref 3.0–12.0)
Neutro Abs: 2.9 K/uL (ref 1.4–7.7)
Neutrophils Relative %: 45.1 % (ref 43.0–77.0)
Platelets: 298 K/uL (ref 150.0–400.0)
RBC: 4.79 Mil/uL (ref 3.87–5.11)
RDW: 14.2 % (ref 11.5–15.5)
WBC: 6.4 K/uL (ref 4.0–10.5)

## 2024-05-05 LAB — LIPID PANEL
Cholesterol: 112 mg/dL (ref 0–200)
HDL: 39.4 mg/dL (ref >39.00)
LDL Cholesterol: 62 mg/dL (ref 0–99)
NonHDL: 72.87
Total CHOL/HDL Ratio: 3
Triglycerides: 56 mg/dL (ref 0.0–149.0)
VLDL: 11.2 mg/dL (ref 0.0–40.0)

## 2024-05-05 LAB — HEPATIC FUNCTION PANEL
ALT: 14 U/L (ref 0–35)
AST: 17 U/L (ref 0–37)
Albumin: 4.2 g/dL (ref 3.5–5.2)
Alkaline Phosphatase: 65 U/L (ref 39–117)
Bilirubin, Direct: 0.1 mg/dL (ref 0.0–0.3)
Total Bilirubin: 0.5 mg/dL (ref 0.2–1.2)
Total Protein: 7 g/dL (ref 6.0–8.3)

## 2024-05-05 LAB — HEMOGLOBIN A1C: Hgb A1c MFr Bld: 6.7 % — ABNORMAL HIGH (ref 4.6–6.5)

## 2024-05-05 LAB — TSH: TSH: 1.08 u[IU]/mL (ref 0.35–5.50)

## 2024-05-05 NOTE — Progress Notes (Signed)
 Patient read Dr. Charis message and reviewed labs.

## 2024-05-10 ENCOUNTER — Encounter: Payer: Self-pay | Admitting: Family Medicine

## 2024-05-19 ENCOUNTER — Encounter: Payer: Self-pay | Admitting: Emergency Medicine

## 2024-05-19 ENCOUNTER — Other Ambulatory Visit: Payer: Self-pay | Admitting: Family Medicine

## 2024-05-19 ENCOUNTER — Other Ambulatory Visit: Payer: Self-pay

## 2024-05-19 ENCOUNTER — Ambulatory Visit
Admission: EM | Admit: 2024-05-19 | Discharge: 2024-05-19 | Discharge status: Against Medical Advice | Attending: Emergency Medicine | Admitting: Emergency Medicine

## 2024-05-19 ENCOUNTER — Emergency Department
Admission: EM | Admit: 2024-05-19 | Discharge: 2024-05-19 | Disposition: A | Discharge status: Home / Self Care | Source: Ambulatory Visit | Attending: Emergency Medicine | Admitting: Emergency Medicine

## 2024-05-19 ENCOUNTER — Emergency Department

## 2024-05-19 DIAGNOSIS — R002 Palpitations: Secondary | ICD-10-CM

## 2024-05-19 DIAGNOSIS — D72829 Elevated white blood cell count, unspecified: Secondary | ICD-10-CM | POA: Insufficient documentation

## 2024-05-19 DIAGNOSIS — R079 Chest pain, unspecified: Secondary | ICD-10-CM | POA: Diagnosis not present

## 2024-05-19 DIAGNOSIS — R059 Cough, unspecified: Secondary | ICD-10-CM | POA: Insufficient documentation

## 2024-05-19 DIAGNOSIS — R0602 Shortness of breath: Secondary | ICD-10-CM

## 2024-05-19 DIAGNOSIS — R0789 Other chest pain: Secondary | ICD-10-CM | POA: Diagnosis present

## 2024-05-19 DIAGNOSIS — R091 Pleurisy: Secondary | ICD-10-CM | POA: Insufficient documentation

## 2024-05-19 LAB — CBC
HCT: 40.6 % (ref 36.0–46.0)
Hemoglobin: 13.4 g/dL (ref 12.0–15.0)
MCH: 26.9 pg (ref 26.0–34.0)
MCHC: 33 g/dL (ref 30.0–36.0)
MCV: 81.5 fL (ref 80.0–100.0)
Platelets: 242 K/uL (ref 150–400)
RBC: 4.98 MIL/uL (ref 3.87–5.11)
RDW: 14.2 % (ref 11.5–15.5)
WBC: 16 K/uL — ABNORMAL HIGH (ref 4.0–10.5)
nRBC: 0 % (ref 0.0–0.2)

## 2024-05-19 LAB — RESP PANEL BY RT-PCR (RSV, FLU A&B, COVID)  RVPGX2
Influenza A by PCR: NEGATIVE
Influenza B by PCR: NEGATIVE
Resp Syncytial Virus by PCR: NEGATIVE
SARS Coronavirus 2 by RT PCR: NEGATIVE

## 2024-05-19 LAB — HEPATIC FUNCTION PANEL
ALT: 14 U/L (ref 0–44)
AST: 17 U/L (ref 15–41)
Albumin: 4.1 g/dL (ref 3.5–5.0)
Alkaline Phosphatase: 75 U/L (ref 38–126)
Bilirubin, Direct: 0.2 mg/dL (ref 0.0–0.2)
Indirect Bilirubin: 0.4 mg/dL (ref 0.3–0.9)
Total Bilirubin: 0.6 mg/dL (ref 0.0–1.2)
Total Protein: 7.7 g/dL (ref 6.5–8.1)

## 2024-05-19 LAB — URINALYSIS, ROUTINE W REFLEX MICROSCOPIC
Bilirubin Urine: NEGATIVE
Glucose, UA: NEGATIVE mg/dL
Hgb urine dipstick: NEGATIVE
Ketones, ur: NEGATIVE mg/dL
Leukocytes,Ua: NEGATIVE
Nitrite: NEGATIVE
Protein, ur: NEGATIVE mg/dL
Specific Gravity, Urine: 1.04 — ABNORMAL HIGH (ref 1.005–1.030)
pH: 5 (ref 5.0–8.0)

## 2024-05-19 LAB — TROPONIN I (HIGH SENSITIVITY)
Troponin I (High Sensitivity): 2 ng/L (ref <18)
Troponin I (High Sensitivity): 3 ng/L (ref <18)

## 2024-05-19 LAB — BASIC METABOLIC PANEL WITH GFR
Anion gap: 10 (ref 5–15)
BUN: 14 mg/dL (ref 6–20)
CO2: 21 mmol/L — ABNORMAL LOW (ref 22–32)
Calcium: 9.2 mg/dL (ref 8.9–10.3)
Chloride: 108 mmol/L (ref 98–111)
Creatinine, Ser: 1.03 mg/dL — ABNORMAL HIGH (ref 0.44–1.00)
GFR, Estimated: >60 mL/min (ref >60)
Glucose, Bld: 93 mg/dL (ref 70–99)
Potassium: 4.1 mmol/L (ref 3.5–5.1)
Sodium: 139 mmol/L (ref 135–145)

## 2024-05-19 LAB — BRAIN NATRIURETIC PEPTIDE: B Natriuretic Peptide: 9.4 pg/mL (ref 0.0–100.0)

## 2024-05-19 LAB — TSH: TSH: 0.735 u[IU]/mL (ref 0.350–4.500)

## 2024-05-19 LAB — LIPASE, BLOOD: Lipase: 31 U/L (ref 11–51)

## 2024-05-19 MED ORDER — KETOROLAC TROMETHAMINE 30 MG/ML IJ SOLN
15.0000 mg | Freq: Once | INTRAMUSCULAR | Status: AC
  Administered 2024-05-19: 15 mg via INTRAVENOUS
  Filled 2024-05-19: qty 1

## 2024-05-19 MED ORDER — ASPIRIN 81 MG PO CHEW
324.0000 mg | CHEWABLE_TABLET | Freq: Once | ORAL | Status: AC
  Administered 2024-05-19: 324 mg via ORAL

## 2024-05-19 MED ORDER — IOHEXOL 350 MG/ML SOLN
75.0000 mL | Freq: Once | INTRAVENOUS | Status: AC | PRN
  Administered 2024-05-19: 75 mL via INTRAVENOUS

## 2024-05-19 MED ORDER — AZITHROMYCIN 250 MG PO TABS: ORAL_TABLET | ORAL | 0 refills | Status: AC

## 2024-05-19 MED ORDER — AZITHROMYCIN 500 MG PO TABS
500.0000 mg | ORAL_TABLET | Freq: Once | ORAL | Status: AC
  Administered 2024-05-19: 500 mg via ORAL
  Filled 2024-05-19: qty 1

## 2024-05-19 NOTE — ED Notes (Signed)
 Blue top sent down.

## 2024-05-19 NOTE — ED Triage Notes (Signed)
 Patient to ED via POV for centralized CP/SOB- ongoing since last week. States pain worse when walking. Denies cardiac hx.

## 2024-05-19 NOTE — ED Notes (Signed)
 Patient is being discharged from the Urgent Care and sent to the Emergency Department via private vehicle . Patient signed AMA due to not wanting to go by EMS to the ED Per Rilla Flood, NP, patient is in need of higher level of care due to chest pain, SOB. Patient is aware and verbalizes understanding of plan of care.  Vitals:   05/19/24 1419  BP: (!) 142/94  Pulse: (!) 104  Resp: 15  Temp: 99.1 F (37.3 C)  SpO2: 98%

## 2024-05-19 NOTE — ED Provider Notes (Signed)
 [facility] Provider Note    Event Date/Time   First MD Initiated Contact with Patient 05/19/24 1733     (approximate)   History   Chest Pain   HPI  Mandeep Ferch is a 53 y.o. female history of migraines, heavy menses  Patient has been noticing slight pain especially when she breathes for roughly a week.  Today walking into work at about 830 this morning she felt that occur, and it was sharp located above her upper breastbone.  She has had a slight cough with it.  No fevers or chills.  No abdominal pain no nausea or vomiting.  No history of heart disease  She also felt like it accompanied by a brief fluttering feeling that went away.  At that time in reviewing her Apple Watch it appears her heart rate did elevate to about 110-130 while she was walking into work, of course we do not have data to tell the rhythm and I do not think [employer] watch is in so they considered a diagnostic medical tool but it does seem to support that her heart rate was elevated at the time  She is feeling better now except for a sharp pain in her chest when she takes a deep breath under the sternum.      Physical Exam   Triage Vital Signs: ED Triage Vitals  Encounter Vitals Group     BP 05/19/24 1505 (!) 153/110     Girls Systolic BP Percentile --      Girls Diastolic BP Percentile --      Boys Systolic BP Percentile --      Boys Diastolic BP Percentile --      Pulse Rate 05/19/24 1505 (!) 110     Resp 05/19/24 1505 17     Temp 05/19/24 1505 98.5 F (36.9 C)     Temp Source 05/19/24 1505 Oral     SpO2 05/19/24 1505 95 %     Weight 05/19/24 1503 183 lb (83 kg)     Height 05/19/24 1503 4' 11 (1.499 m)     Head Circumference --      Peak Flow --      Pain Score 05/19/24 1503 6     Pain Loc --      Pain Education --      Exclude from Growth Chart --     Most recent vital signs: Vitals:   05/19/24 2100 05/19/24 2144  BP: 103/71 105/74  Pulse:  76  Resp:   17  Temp:    SpO2:  98%     General: Awake, no distress.  Pleasant CV:  Good peripheral perfusion.  Normal tones and rate. Resp:  Normal effort.  Clear bilateral with normal work of breathing.  When she does take deep inspiration though she reports a pleuritic pain over the central chest and sternum.  Does not appear in any distress.  Pain is slightly worsened by laying flat, alleviated somewhat by sitting upright Abd:  No distention.  Soft nontender nondistended.  I have examined her abdomen on initial evaluation and also just prior to discharge.  Patient endorses no abdominal pain abdomen is soft nontender and nondistended all quadrants on both examination Other:  Very pleasant.  Escorted by her daughter.  She is in no acute distress or extremis but does appear to have elements of pleuritic chest pain.   ED Results / Procedures / Treatments   Labs (all labs ordered are  listed, but only abnormal results are displayed) Labs Reviewed  BASIC METABOLIC PANEL WITH GFR - Abnormal; Notable for the following components:      Result Value   CO2 21 (*)    Creatinine, Ser 1.03 (*)    All other components within normal limits  CBC - Abnormal; Notable for the following components:   WBC 16.0 (*)    All other components within normal limits  URINALYSIS, ROUTINE W REFLEX MICROSCOPIC - Abnormal; Notable for the following components:   Color, Urine STRAW (*)    APPearance CLEAR (*)    Specific Gravity, Urine 1.040 (*)    All other components within normal limits  RESP PANEL BY RT-PCR (RSV, FLU A&B, COVID)  RVPGX2  BRAIN NATRIURETIC PEPTIDE  HEPATIC FUNCTION PANEL  LIPASE, BLOOD  TSH  TROPONIN I (HIGH SENSITIVITY)  TROPONIN I (HIGH SENSITIVITY)   Troponin normal x 2.  Labs notable for leukocytosis of 16,000.  No associated abdominal pain.  Symptoms of cough pleuritic pain for about a week.  Suspect elevated secondary to potentially infectious cause though pain inflammation and irritation would  also be considered.  Upon workup in the ER no obvious evidence of acute bacterial infection, but would entertain potential for viral illness as well.  Given her symptoms of about 1 weeks time pleuritic component and elevated white count we will treat with azithromycin  in the event of atypical infection leading to her pleurisy, also potential for viral cause strongly considered.  No evidence of acute ACS.  CT imaging negative for PE  EKG  Interpreted by me at 1510 heart rate 100 QRS 60 QTc 420 Normal sinus rhythm, borderline sinus tachycardia but no ischemic abnormality.   RADIOLOGY  CT Angio Chest PE W and/or Wo Contrast Result Date: 05/19/2024 CLINICAL DATA:  Pulmonary embolus suspected with high probability. Chest pain off and on since last Friday. Shortness of breath and heart fluttering. EXAM: CT ANGIOGRAPHY CHEST WITH CONTRAST TECHNIQUE: Multidetector CT imaging of the chest was performed using the standard protocol during bolus administration of intravenous contrast. Multiplanar CT image reconstructions and MIPs were obtained to evaluate the vascular anatomy. RADIATION DOSE REDUCTION: This exam was performed according to the departmental dose-optimization program which includes automated exposure control, adjustment of the mA and/or kV according to patient size and/or use of iterative reconstruction technique. CONTRAST:  75mL OMNIPAQUE  IOHEXOL  350 MG/ML SOLN COMPARISON:  Chest radiograph 05/19/2024 FINDINGS: Cardiovascular: Good opacification of the central and segmental pulmonary arteries. No focal filling defects. No evidence of significant pulmonary embolus. Normal heart size. No pericardial effusions. Normal caliber thoracic aorta. No aortic dissection. Great vessel origins are patent. Mediastinum/Nodes: Small esophageal hiatal hernia. Esophagus is decompressed. Scattered mediastinal and axillary lymph nodes are not pathologically enlarged, likely reactive. Lungs/Pleura: Lungs are clear.  No  pleural effusion or pneumothorax. Upper Abdomen: No acute abnormality. Musculoskeletal: No chest wall abnormality. No acute or significant osseous findings. Review of the MIP images confirms the above findings. IMPRESSION: 1. No evidence of significant pulmonary embolus. 2. No evidence of active pulmonary disease. Electronically Signed   By: Elsie Gravely M.D.   On: 05/19/2024 18:45   DG Chest 2 View Result Date: 05/19/2024 CLINICAL DATA:  cp EXAM: CHEST - 2 VIEW COMPARISON:  None available. FINDINGS: No focal airspace consolidation, pleural effusion, or pneumothorax. No cardiomegaly.No acute fracture or destructive lesion. Multilevel thoracic osteophytosis. IMPRESSION: No acute cardiopulmonary abnormality. Electronically Signed   By: Rogelia Myers M.D.   On: 05/19/2024 15:31  PROCEDURES:  Critical Care performed: No  Procedures   MEDICATIONS ORDERED IN ED: Medications  ketorolac  (TORADOL ) 30 MG/ML injection 15 mg (15 mg Intravenous Given 05/19/24 1815)  iohexol  (OMNIPAQUE ) 350 MG/ML injection 75 mL (75 mLs Intravenous Contrast Given 05/19/24 1815)  azithromycin  (ZITHROMAX ) tablet 500 mg (500 mg Oral Given 05/19/24 2141)     IMPRESSION / MDM / ASSESSMENT AND PLAN / ED COURSE  I reviewed the triage vital signs and the nursing notes.                              Differential diagnosis includes, but is not limited to, ACS, aortic dissection, pulmonary embolism, cardiac tamponade, pneumothorax, pneumonia, pericarditis, myocarditis, GI-related causes including esophagitis/gastritis, and musculoskeletal chest wall pain.    Patient describes a pleuritic type pain.  About a week some symptoms suggestive of viral illness.  Negative COVID and influenza testing.  Patient's presentation is most consistent with acute complicated illness / injury requiring diagnostic workup.   Symptoms would be extremely atypical of ACS.  Reassuring workup.  CT no pericardial effusions or abnormalities.   No associate abdominal pain on repeat examination.  No GI symptoms  Discussed with patient and her daughter very comfortable with plan for discharge.  Pain much improved with Toradol .  Will use over-the-counter pain medication, NSAIDs and I did recommend she follow-up with PCP and referral made to cardiology for follow-up.  Would entertain the possibility of pleurisy or perhaps a very mild pericarditis type symptoms though likely of a viral type etiology.  Will treat with azithromycin  given the elevated white count and longevity of symptoms.  Careful return precautions advised  Return precautions and treatment recommendations and follow-up discussed with the patient who is agreeable with the plan.        FINAL CLINICAL IMPRESSION(S) / ED DIAGNOSES   Final diagnoses:  Pleurisy  Atypical chest pain     Rx / DC Orders   ED Discharge Orders          Ordered    Ambulatory referral to Cardiology       Comments: ER chest pain follow-up   05/19/24 2138    azithromycin  (ZITHROMAX ) 250 MG tablet        05/19/24 2139             Note:  This document was prepared using Dragon voice recognition software and may include unintentional dictation errors.   Dicky Anes, MD 05/20/24 [phone]

## 2024-05-19 NOTE — ED Triage Notes (Addendum)
 Patient c/o chest pain off and on since last Friday and this morning states that the pain got worse.  Patient also reports SOB and heart fluttering.  Patient states that she did start taking OTC collagen and menopausal medicine on Tuesday.

## 2024-05-19 NOTE — ED Notes (Addendum)
 Pt advised she hasn't been sick recently and no urinary symptoms. Pt denies any current CP. Pt said she gets pain when she gets up and walks across the room and becomes more SOB when walking and has CP.

## 2024-05-19 NOTE — Discharge Instructions (Addendum)
 Go to ER for further evaluation of chest pain,palpitations, shortness of breath

## 2024-05-19 NOTE — ED Provider Notes (Signed)
 [facility]    CSN: [id number] Arrival date & time: 05/19/24  1403      History   Chief Complaint Chief Complaint  Patient presents with   Chest Pain   Shortness of Breath    HPI Cheyenne Larsen is a 53 y.o. female.   53 year old female, Cheyenne Larsen, presents to urgent care for evaluation of chest pain, shortness of breath, palpitations for 1 week.  Patient states the pain is 6 out of 10 and radiates from midsternal region to neck at times. No meds tried. No known illness exposure.  Patient states she has been taken collagen and menopausal supplements to help with hot flashes recently.   The history is provided by the patient. No language interpreter was used.    Past Medical History:  Diagnosis Date   Bilateral leg edema    Fatty liver    GERD (gastroesophageal reflux disease)    Glaucoma    Headache    Migraines   Heavy menses     Patient Active Problem List   Diagnosis Date Noted   Chest pain 05/19/2024   Shortness of breath 05/19/2024   Palpitations 05/19/2024   Diabetes mellitus type II, controlled (HCC) 03/12/2023   Hyperlipidemia associated with type 2 diabetes mellitus (HCC) 03/12/2023   Elevated BP without diagnosis of hypertension 01/17/2021   Anxiety and depression 01/17/2021   Vitamin D  deficiency 07/19/2018   Status post abdominal hysterectomy 03/31/2016   Esophageal dysphagia 02/19/2016   Morbid obesity (HCC) 11/21/2014   Hemorrhoids 01/08/2012   Family history of blood clots 01/08/2012   Routine general medical examination at a health care facility 01/08/2012   GERD (gastroesophageal reflux disease) 01/08/2012   Goiter 10/17/2010    Past Surgical History:  Procedure Laterality Date   ABDOMINAL HYSTERECTOMY N/A 03/31/2016   Procedure: HYSTERECTOMY ABDOMINAL;  Surgeon: Gigi Botts, MD;  Location: [facility];  Service: Gynecology;  Laterality: N/A;   CESAREAN SECTION     CYSTOSCOPY N/A 03/31/2016   Procedure:  CYSTOSCOPY;  Surgeon: Gigi Botts, MD;  Location: [facility];  Service: Gynecology;  Laterality: N/A;   EYE SURGERY  12/05/2020   laser surgery left   EYE SURGERY  11/07/2020   laser surgery rt    TOTAL ABDOMINAL HYSTERECTOMY N/A 03/31/2016    OB History     Gravida  2   Para  2   Term      Preterm      AB      Living  2      SAB      IAB      Ectopic      Multiple      Live Births               Home Medications    Prior to Admission medications   Medication Sig Start Date End Date Taking? Authorizing Provider  brinzolamide (AZOPT) 1 % ophthalmic suspension SMARTSIG:In Eye(s) 01/28/21   [provider]  cetirizine (ZYRTEC) 10 MG tablet Take 10 mg by mouth at bedtime.    [provider]  hydrocortisone  (ANUSOL -HC) 25 MG suppository Place 1 suppository (25 mg total) rectally 2 (two) times daily. 11/04/23   Tabori, Katherine E, MD  omeprazole  (PRILOSEC) 40 MG capsule Take 1 capsule (40 mg total) by mouth daily. 11/04/23   Tabori, Katherine E, MD  rosuvastatin  (CRESTOR ) 5 MG tablet Take 1 tablet (5 mg total) by mouth daily. 11/04/23   Tabori, Katherine E,  MD  Semaglutide ,0.25 or 0.5MG /DOS, (OZEMPIC , 0.25 OR 0.5 MG/DOSE,) 2 MG/3ML SOPN INJECT 0.25 MG SUBCUTANEOUSLY ONE TIME PER WEEK 05/19/24   Tabori, Katherine E, MD  timolol (TIMOPTIC) 0.5 % ophthalmic solution  09/02/18   [provider]  VITAMIN D  PO Take by mouth.    [provider]    Family History Family History  Problem Relation Age of Onset   Hypertension Mother    Hypertension Father    Diabetes Father        type 2   Glaucoma Father    Heart disease Sister    Heart attack Maternal Grandmother    Bone cancer Maternal Grandfather    Ovarian cancer Paternal Grandmother    Alzheimer's disease Paternal Grandfather    Diabetes Brother    Esophageal cancer Neg Hx    Colon cancer Neg Hx    Rectal cancer Neg Hx    Stomach cancer Neg Hx     Social History Social History    Tobacco Use   Smoking status: Never   Smokeless tobacco: Never  Vaping Use   Vaping status: Never Used  Substance Use Topics   Alcohol use: No    Alcohol/week: 0.0 standard drinks of alcohol   Drug use: No     Allergies   Influenza vaccines   Review of Systems Review of Systems  Constitutional:  Negative for fever.  Respiratory:  Positive for shortness of breath. Negative for cough.   Cardiovascular:  Positive for chest pain and palpitations.  All other systems reviewed and are negative.    Physical Exam Triage Vital Signs ED Triage Vitals  Encounter Vitals Group     BP 05/19/24 1419 (!) 142/94     Girls Systolic BP Percentile --      Girls Diastolic BP Percentile --      Boys Systolic BP Percentile --      Boys Diastolic BP Percentile --      Pulse Rate 05/19/24 1419 (!) 104     Resp 05/19/24 1419 15     Temp 05/19/24 1419 99.1 F (37.3 C)     Temp Source 05/19/24 1419 Oral     SpO2 05/19/24 1419 98 %     Weight 05/19/24 1417 188 lb 7.9 oz (85.5 kg)     Height 05/19/24 1417 4' 11 (1.499 m)     Head Circumference --      Peak Flow --      Pain Score 05/19/24 1417 5     Pain Loc --      Pain Education --      Exclude from Growth Chart --    No data found.  Updated Vital Signs BP (!) 142/94 (BP Location: Left Arm)   Pulse (!) 104   Temp 99.1 F (37.3 C) (Oral)   Resp 15   Ht 4' 11 (1.499 m)   Wt 188 lb 7.9 oz (85.5 kg)   LMP 12/31/2011   SpO2 98%   BMI 38.07 kg/m   Visual Acuity Right Eye Distance:   Left Eye Distance:   Bilateral Distance:    Right Eye Near:   Left Eye Near:    Bilateral Near:     Physical Exam Vitals and nursing note reviewed.  Constitutional:      Appearance: She is well-developed and well-groomed.  HENT:     Head: Normocephalic.  Cardiovascular:     Rate and Rhythm: Normal rate and regular rhythm.     Heart  sounds: Normal heart sounds.  Pulmonary:     Effort: Pulmonary effort is normal.     Breath sounds:  Normal breath sounds and air entry.  Neurological:     General: No focal deficit present.     Mental Status: She is alert and oriented to person, place, and time.     GCS: GCS eye subscore is 4. GCS verbal subscore is 5. GCS motor subscore is 6.  Psychiatric:        Attention and Perception: Attention normal.        Mood and Affect: Mood normal.        Speech: Speech normal.        Behavior: Behavior normal. Behavior is cooperative.      UC Treatments / Results  Labs (all labs ordered are listed, but only abnormal results are displayed) Labs Reviewed - No data to display  EKG   Radiology DG Chest 2 View Result Date: 05/19/2024 CLINICAL DATA:  cp EXAM: CHEST - 2 VIEW COMPARISON:  None available. FINDINGS: No focal airspace consolidation, pleural effusion, or pneumothorax. No cardiomegaly.No acute fracture or destructive lesion. Multilevel thoracic osteophytosis. IMPRESSION: No acute cardiopulmonary abnormality. Electronically Signed   By: Rogelia Myers M.D.   On: 05/19/2024 15:31    Procedures Procedures (including critical care time)  Medications Ordered in UC Medications  aspirin  chewable tablet 324 mg (324 mg Oral Given 05/19/24 1437)    Initial Impression / Assessment and Plan / UC Course  I have reviewed the triage vital signs and the nursing notes.  Pertinent labs & imaging results that were available during my care of the patient were reviewed by me and considered in my medical decision making (see chart for details).  Clinical Course as of 05/19/24 1731  Fri May 19, 2024  1421 EKG shows NSR rate 99, QTC 451, no STEMI, no ectopy [JD]    Clinical Course User Index [JD] Tilly Pernice, NP   The patient has been advised that they should be evaluated in the emergency department due to concern for chest pain,palpitations, shortness of breath .   I explained that the emergency room can provide resources not available in the urgent care. They have declined   arrangement of transportation to the emergency room at this time and want to be discharged.  The patient was treated to the extent that they would allow.  The patient had the opportunity to ask questions about their medical condition.   The benefits of seeking and risks of not seeking emergency room evaluation were discussed and explained to the patient including but not limited to, worsening of their condition, chronic pain, permanent disability, and death. The patient has decision-making capacity and voiced that they understand the information and the potential consequences of leaving Against Medical Advice. The patient did complete and sign an AMA form which was witnessed by [employer] .   Ddx: Chest pain,palpitations, ACS, shortness of breath, PE, Viral illness(covid) Final Clinical Impressions(s) / UC Diagnoses   Final diagnoses:  Chest pain, unspecified type  Shortness of breath  Palpitations     Discharge Instructions      Go to ER for further evaluation of chest pain,palpitations, shortness of breath     ED Prescriptions   None    PDMP not reviewed this encounter.   Aminta Loose, NP 05/19/24 1731

## 2024-05-23 NOTE — Assessment & Plan Note (Signed)
 Ongoing issue.  Pt is down 4 lbs since last visit.  BMI now 37.42 but combined w/ DM and hyperlipidemia, this qualifies as morbidly obese.  She is going to the gym 2-3x/week.  Applauded her efforts.  Encouraged low carb diet.  Will follow.

## 2024-05-23 NOTE — Assessment & Plan Note (Signed)
 Chronic problem.  On Crestor  5mg  nightly w/o difficulty.  Check labs.  Adjust meds prn

## 2024-05-23 NOTE — Assessment & Plan Note (Signed)
 Chronic problem.  On Ozempic  0.25mg  weekly.  Foot exam done today.  Eye exam scheduled for November.  Currently asymptomatic.  Last A1C 6.1%  Pt would like to increase Ozempic  to 0.5mg  weekly.  New prescription sent.

## 2024-07-13 ENCOUNTER — Ambulatory Visit: Attending: Medical | Admitting: Medical

## 2024-07-13 ENCOUNTER — Encounter: Payer: Self-pay | Admitting: Medical

## 2024-07-13 VITALS — BP 112/82 | HR 85 | Ht 60.0 in | Wt 189.1 lb

## 2024-07-13 DIAGNOSIS — E782 Mixed hyperlipidemia: Secondary | ICD-10-CM

## 2024-07-13 DIAGNOSIS — R091 Pleurisy: Secondary | ICD-10-CM

## 2024-07-13 DIAGNOSIS — R079 Chest pain, unspecified: Secondary | ICD-10-CM | POA: Diagnosis not present

## 2024-07-13 NOTE — Progress Notes (Signed)
  Cardiology Office Note   Date:  07/13/2024  ID:  Cheyenne Larsen, Cheyenne Larsen 06-11-1971, MRN [id number] PCP: Mahlon Comer BRAVO, MD  [facility] Providers Cardiologist:  New  History of Present Illness Cheyenne Larsen is a 53 y.o. female with a h/o migraines, HLD, DM2 and heavy menses who presents as a new patient.  Family history-grandmother passed 44 with MI. No history of drug ,tobacco alcohol use.   The patient was seen in the ER 9/262/25 for chest pain described as pleuritic pain. BP 153/110. Hr 110bpm.Labs showed WBC 16. HS trop normal x 2. Cxr nonacute. CT chest negative for PE. she was given zpack .  Today, the patient denies further pleuritic pain. She had one episode of dull pain while sitting on the couch. She denies exertional chest pain, but has not been working out. She denies SOB, lower leg edema, lightheadedness, dizziness, palpitations.   Studies Reviewed EKG Interpretation Date/Time:  Thursday July 13 2024 13:29:48 EST Ventricular Rate:  85 PR Interval:  168 QRS Duration:  48 QT Interval:  350 QTC Calculation: 416 R Axis:   -10  Text Interpretation: Normal sinus rhythm Minimal voltage criteria for LVH, may be normal variant ( R in aVL ) Inferior infarct (cited on or before 19-May-2024) Anterolateral infarct (cited on or before 19-May-2024) When compared with ECG of 19-May-2024 15:06, Questionable change in initial forces of Lateral leads Confirmed by Franchester, Lexxus Underhill ([id number]) on 07/13/2024 1:32:28 PM        Physical Exam VS:  BP 112/82 (BP Location: Right Arm, Cuff Size: Large)   Pulse 85   Ht 5' (1.524 m)   Wt 189 lb 2 oz (85.8 kg)   LMP 12/31/2011   SpO2 98%   BMI 36.94 kg/m        Wt Readings from Last 3 Encounters:  07/13/24 189 lb 2 oz (85.8 kg)  05/19/24 183 lb (83 kg)  05/19/24 188 lb 7.9 oz (85.5 kg)    GEN: Well nourished, well developed in no acute distress NECK: No JVD; No carotid bruits CARDIAC: RRR, no murmurs,  rubs, gallops RESPIRATORY:  Clear to auscultation without rales, wheezing or rhonchi  ABDOMEN: Soft, non-tender, non-distended EXTREMITIES:  No edema; No deformity   ASSESSMENT AND PLAN  Chest pain Patient went to the ER in September 2025 with pleuritic type chest pain.  WBC was elevated and heart rate was mildly tachycardic.  She was given azithromycin  for possible infection/inflammation.  Since that time she denies any further pleuritic chest pain. She has stopped working out since then due to fear of recurrent chest pain.  She does have occasional dull episodes that are very brief, but not associated with exertion. Risk factors for CAD include hyperlipidemia, diabetes, and family history. I will order an echocardiogram.  Patient plans to go back to the gym to assess exertional symptoms.  We will see her back in follow-up to consider ischemic testing.    Hyperlipidemia LDL 62 on Crestor  5 mg daily.     Dispo: Follow-up in 2 months  Signed, Lukus Binion VEAR Franchester, PA-C

## 2024-07-13 NOTE — Patient Instructions (Signed)
 Medication Instructions:  Your physician recommends that you continue on your current medications as directed. Please refer to the Current Medication list given to you today.    *If you need a refill on your cardiac medications before your next appointment, please call your pharmacy*  Lab Work: No labs ordered today    Testing/Procedures: Your physician has requested that you have an echocardiogram. Echocardiography is a painless test that uses sound waves to create images of your heart. It provides your doctor with information about the size and shape of your heart and how well your heart's chambers and valves are working.   You may receive an ultrasound enhancing agent through an IV if needed to better visualize your heart during the echo. This procedure takes approximately one hour.  There are no restrictions for this procedure.  This will take place at [address] [facility] [address], [place] [address]  Please note: We ask at that you not bring children with you during ultrasound (echo/ vascular) testing. Due to room size and safety concerns, children are not allowed in the ultrasound rooms during exams. Our front office staff cannot provide observation of children in our lobby area while testing is being conducted. An adult accompanying a patient to their appointment will only be allowed in the ultrasound room at the discretion of the ultrasound technician under special circumstances. We apologize for any inconvenience.   Follow-Up: At [facility], you and your health needs are our priority.  As part of our continuing mission to provide you with exceptional heart care, our providers are all part of one team.  This team includes your primary Cardiologist (physician) and Advanced Practice Providers or APPs (Physician Assistants and Nurse Practitioners) who all work together to provide you with the care you need, when you need it.  Your next appointment:   2  month(s)  Provider:   Mikey Fishman, PA-C

## 2024-08-16 ENCOUNTER — Ambulatory Visit

## 2024-09-19 ENCOUNTER — Ambulatory Visit: Admitting: Medical
